# Patient Record
Sex: Male | Born: 1955 | Race: Black or African American | Hispanic: No | Marital: Single | Smoking: Current every day smoker
Health system: Southern US, Community
[De-identification: ages and names within clinical notes are randomized; demographics above are authoritative.]

## PROBLEM LIST (undated history)

## (undated) DIAGNOSIS — E119 Type 2 diabetes mellitus without complications: Secondary | ICD-10-CM

## (undated) DIAGNOSIS — I739 Peripheral vascular disease, unspecified: Secondary | ICD-10-CM

## (undated) DIAGNOSIS — I10 Essential (primary) hypertension: Secondary | ICD-10-CM

## (undated) HISTORY — PX: COLONOSCOPY: SHX174

---

## 2006-11-30 ENCOUNTER — Emergency Department: Payer: Self-pay | Admitting: Emergency Medicine

## 2014-11-09 ENCOUNTER — Emergency Department: Payer: No Typology Code available for payment source

## 2014-11-09 ENCOUNTER — Emergency Department
Admission: EM | Admit: 2014-11-09 | Discharge: 2014-11-09 | Disposition: A | Discharge status: Home / Self Care | Payer: No Typology Code available for payment source | Attending: Emergency Medicine | Admitting: Emergency Medicine

## 2014-11-09 DIAGNOSIS — Y998 Other external cause status: Secondary | ICD-10-CM | POA: Diagnosis not present

## 2014-11-09 DIAGNOSIS — S4991XA Unspecified injury of right shoulder and upper arm, initial encounter: Secondary | ICD-10-CM | POA: Diagnosis present

## 2014-11-09 DIAGNOSIS — S40011A Contusion of right shoulder, initial encounter: Secondary | ICD-10-CM | POA: Insufficient documentation

## 2014-11-09 DIAGNOSIS — Y9389 Activity, other specified: Secondary | ICD-10-CM | POA: Insufficient documentation

## 2014-11-09 DIAGNOSIS — Y92410 Unspecified street and highway as the place of occurrence of the external cause: Secondary | ICD-10-CM | POA: Diagnosis not present

## 2014-11-09 DIAGNOSIS — S46911A Strain of unspecified muscle, fascia and tendon at shoulder and upper arm level, right arm, initial encounter: Secondary | ICD-10-CM | POA: Diagnosis not present

## 2014-11-09 DIAGNOSIS — T148XXA Other injury of unspecified body region, initial encounter: Secondary | ICD-10-CM

## 2014-11-09 DIAGNOSIS — M25511 Pain in right shoulder: Secondary | ICD-10-CM

## 2014-11-09 MED ORDER — TRAMADOL HCL 50 MG PO TABS
50.0000 mg | ORAL_TABLET | Freq: Four times a day (QID) | ORAL | Status: AC | PRN
Start: 2014-11-09 — End: 2015-11-09

## 2014-11-09 MED ORDER — TRAMADOL HCL 50 MG PO TABS
50.0000 mg | ORAL_TABLET | Freq: Once | ORAL | Status: AC
  Administered 2014-11-09: 50 mg via ORAL

## 2014-11-09 MED ORDER — IBUPROFEN 800 MG PO TABS
800.0000 mg | ORAL_TABLET | Freq: Once | ORAL | Status: AC
  Administered 2014-11-09: 800 mg via ORAL

## 2014-11-09 MED ORDER — IBUPROFEN 800 MG PO TABS
ORAL_TABLET | ORAL | Status: AC
  Administered 2014-11-09: 800 mg via ORAL
  Filled 2014-11-09: qty 1

## 2014-11-09 MED ORDER — IBUPROFEN 800 MG PO TABS: 800.0000 mg | ORAL_TABLET | Freq: Three times a day (TID) | ORAL | Status: DC | PRN

## 2014-11-09 MED ORDER — TRAMADOL HCL 50 MG PO TABS
ORAL_TABLET | ORAL | Status: AC
  Administered 2014-11-09: 50 mg via ORAL
  Filled 2014-11-09: qty 1
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
## 2016-05-30 ENCOUNTER — Encounter: Payer: Self-pay | Admitting: Emergency Medicine

## 2016-05-30 ENCOUNTER — Inpatient Hospital Stay
Admission: EM | Admit: 2016-05-30 | Discharge: 2016-06-02 | DRG: 369 | Disposition: A | Discharge status: Home / Self Care | Payer: Medicare Other | Attending: Internal Medicine | Admitting: Internal Medicine

## 2016-05-30 DIAGNOSIS — K226 Gastro-esophageal laceration-hemorrhage syndrome: Principal | ICD-10-CM | POA: Diagnosis present

## 2016-05-30 DIAGNOSIS — R7989 Other specified abnormal findings of blood chemistry: Secondary | ICD-10-CM | POA: Diagnosis not present

## 2016-05-30 DIAGNOSIS — E785 Hyperlipidemia, unspecified: Secondary | ICD-10-CM | POA: Diagnosis present

## 2016-05-30 DIAGNOSIS — I10 Essential (primary) hypertension: Secondary | ICD-10-CM | POA: Diagnosis present

## 2016-05-30 DIAGNOSIS — M6281 Muscle weakness (generalized): Secondary | ICD-10-CM

## 2016-05-30 DIAGNOSIS — E876 Hypokalemia: Secondary | ICD-10-CM | POA: Diagnosis present

## 2016-05-30 DIAGNOSIS — E860 Dehydration: Secondary | ICD-10-CM | POA: Diagnosis present

## 2016-05-30 DIAGNOSIS — R319 Hematuria, unspecified: Secondary | ICD-10-CM | POA: Diagnosis present

## 2016-05-30 DIAGNOSIS — D62 Acute posthemorrhagic anemia: Secondary | ICD-10-CM | POA: Diagnosis present

## 2016-05-30 DIAGNOSIS — Z7982 Long term (current) use of aspirin: Secondary | ICD-10-CM

## 2016-05-30 DIAGNOSIS — Z79899 Other long term (current) drug therapy: Secondary | ICD-10-CM | POA: Diagnosis not present

## 2016-05-30 DIAGNOSIS — I429 Cardiomyopathy, unspecified: Secondary | ICD-10-CM | POA: Diagnosis present

## 2016-05-30 DIAGNOSIS — N179 Acute kidney failure, unspecified: Secondary | ICD-10-CM | POA: Diagnosis present

## 2016-05-30 DIAGNOSIS — Z23 Encounter for immunization: Secondary | ICD-10-CM

## 2016-05-30 DIAGNOSIS — Z7984 Long term (current) use of oral hypoglycemic drugs: Secondary | ICD-10-CM | POA: Diagnosis not present

## 2016-05-30 DIAGNOSIS — K2980 Duodenitis without bleeding: Secondary | ICD-10-CM | POA: Diagnosis present

## 2016-05-30 DIAGNOSIS — E119 Type 2 diabetes mellitus without complications: Secondary | ICD-10-CM | POA: Diagnosis present

## 2016-05-30 DIAGNOSIS — F17200 Nicotine dependence, unspecified, uncomplicated: Secondary | ICD-10-CM | POA: Diagnosis present

## 2016-05-30 DIAGNOSIS — F1010 Alcohol abuse, uncomplicated: Secondary | ICD-10-CM | POA: Diagnosis present

## 2016-05-30 DIAGNOSIS — K2970 Gastritis, unspecified, without bleeding: Secondary | ICD-10-CM | POA: Diagnosis present

## 2016-05-30 DIAGNOSIS — K920 Hematemesis: Secondary | ICD-10-CM | POA: Diagnosis present

## 2016-05-30 DIAGNOSIS — Z833 Family history of diabetes mellitus: Secondary | ICD-10-CM | POA: Diagnosis not present

## 2016-05-30 DIAGNOSIS — K3189 Other diseases of stomach and duodenum: Secondary | ICD-10-CM | POA: Diagnosis present

## 2016-05-30 DIAGNOSIS — I959 Hypotension, unspecified: Secondary | ICD-10-CM | POA: Diagnosis present

## 2016-05-30 DIAGNOSIS — K922 Gastrointestinal hemorrhage, unspecified: Secondary | ICD-10-CM | POA: Diagnosis present

## 2016-05-30 HISTORY — DX: Type 2 diabetes mellitus without complications: E11.9

## 2016-05-30 HISTORY — DX: Essential (primary) hypertension: I10

## 2016-05-30 LAB — CBC
HCT: 35.4 % — ABNORMAL LOW (ref 40.0–52.0)
Hemoglobin: 12.2 g/dL — ABNORMAL LOW (ref 13.0–18.0)
MCH: 34.2 pg — ABNORMAL HIGH (ref 26.0–34.0)
MCHC: 34.4 g/dL (ref 32.0–36.0)
MCV: 99.5 fL (ref 80.0–100.0)
Platelets: 199 10*3/uL (ref 150–440)
RBC: 3.55 MIL/uL — ABNORMAL LOW (ref 4.40–5.90)
RDW: 14.9 % — AB (ref 11.5–14.5)
WBC: 10.5 10*3/uL (ref 3.8–10.6)

## 2016-05-30 LAB — PROTIME-INR
INR: 0.99
PROTHROMBIN TIME: 13.1 s (ref 11.4–15.2)

## 2016-05-30 LAB — COMPREHENSIVE METABOLIC PANEL
ALBUMIN: 3.9 g/dL (ref 3.5–5.0)
ALT: 15 U/L — ABNORMAL LOW (ref 17–63)
AST: 20 U/L (ref 15–41)
Alkaline Phosphatase: 50 U/L (ref 38–126)
Anion gap: 14 (ref 5–15)
BUN: 68 mg/dL — AB (ref 6–20)
CO2: 28 mmol/L (ref 22–32)
Calcium: 8.9 mg/dL (ref 8.9–10.3)
Chloride: 96 mmol/L — ABNORMAL LOW (ref 101–111)
Creatinine, Ser: 2.13 mg/dL — ABNORMAL HIGH (ref 0.61–1.24)
GFR calc Af Amer: 37 mL/min — ABNORMAL LOW (ref >60)
GFR calc non Af Amer: 32 mL/min — ABNORMAL LOW (ref >60)
Glucose, Bld: 242 mg/dL — ABNORMAL HIGH (ref 65–99)
Potassium: 2.9 mmol/L — ABNORMAL LOW (ref 3.5–5.1)
SODIUM: 138 mmol/L (ref 135–145)
Total Bilirubin: 0.9 mg/dL (ref 0.3–1.2)
Total Protein: 6.9 g/dL (ref 6.5–8.1)

## 2016-05-30 LAB — MAGNESIUM: MAGNESIUM: 1.8 mg/dL (ref 1.7–2.4)

## 2016-05-30 LAB — APTT: APTT: 28 s (ref 24–36)

## 2016-05-30 LAB — GLUCOSE, CAPILLARY: Glucose-Capillary: 148 mg/dL — ABNORMAL HIGH (ref 65–99)

## 2016-05-30 LAB — TROPONIN I
TROPONIN I: 0.18 ng/mL — AB (ref <0.03)
Troponin I: 0.06 ng/mL (ref <0.03)

## 2016-05-30 LAB — TYPE AND SCREEN
ABO/RH(D): O POS
Antibody Screen: NEGATIVE

## 2016-05-30 LAB — HEMOGLOBIN: HEMOGLOBIN: 9.6 g/dL — AB (ref 13.0–18.0)

## 2016-05-30 MED ORDER — SODIUM CHLORIDE 0.9 % IV SOLN
1000.0000 mL | Freq: Once | INTRAVENOUS | Status: AC
  Administered 2016-05-30: 1000 mL via INTRAVENOUS

## 2016-05-30 MED ORDER — ONDANSETRON HCL 4 MG PO TABS: 4.0000 mg | ORAL_TABLET | Freq: Four times a day (QID) | ORAL | Status: DC | PRN

## 2016-05-30 MED ORDER — ACETAMINOPHEN 650 MG RE SUPP: 650.0000 mg | Freq: Four times a day (QID) | RECTAL | Status: DC | PRN

## 2016-05-30 MED ORDER — ACETAMINOPHEN 325 MG PO TABS: 650.0000 mg | ORAL_TABLET | Freq: Four times a day (QID) | ORAL | Status: DC | PRN

## 2016-05-30 MED ORDER — POTASSIUM CHLORIDE IN NACL 40-0.9 MEQ/L-% IV SOLN
INTRAVENOUS | Status: DC
  Administered 2016-05-30 – 2016-05-31 (×5): 125 mL/h via INTRAVENOUS
  Filled 2016-05-30 (×9): qty 1000

## 2016-05-30 MED ORDER — ONDANSETRON HCL 4 MG/2ML IJ SOLN: 4.0000 mg | Freq: Four times a day (QID) | INTRAMUSCULAR | Status: DC | PRN

## 2016-05-30 MED ORDER — ATORVASTATIN CALCIUM 20 MG PO TABS
40.0000 mg | ORAL_TABLET | Freq: Every day | ORAL | Status: DC
Start: 2016-05-30 — End: 2016-06-02
  Administered 2016-05-30 – 2016-06-02 (×3): 40 mg via ORAL
  Filled 2016-05-30 (×3): qty 2

## 2016-05-30 MED ORDER — POTASSIUM CHLORIDE CRYS ER 20 MEQ PO TBCR
40.0000 meq | EXTENDED_RELEASE_TABLET | Freq: Once | ORAL | Status: AC
  Administered 2016-05-30: 40 meq via ORAL
  Filled 2016-05-30: qty 2

## 2016-05-30 MED ORDER — PANTOPRAZOLE SODIUM 40 MG IV SOLR
40.0000 mg | Freq: Two times a day (BID) | INTRAVENOUS | Status: DC
  Administered 2016-05-30 – 2016-06-02 (×6): 40 mg via INTRAVENOUS
  Filled 2016-05-30 (×6): qty 40

## 2016-05-30 MED ORDER — ALBUTEROL SULFATE (2.5 MG/3ML) 0.083% IN NEBU: 2.5000 mg | INHALATION_SOLUTION | RESPIRATORY_TRACT | Status: DC | PRN

## 2016-05-30 MED ORDER — NICOTINE 21 MG/24HR TD PT24
21.0000 mg | MEDICATED_PATCH | Freq: Every day | TRANSDERMAL | Status: DC
  Administered 2016-05-30 – 2016-06-02 (×3): 21 mg via TRANSDERMAL
  Filled 2016-05-30 (×3): qty 1

## 2016-05-30 NOTE — ED Notes (Signed)
Patient would like his friend/caregiver called for all updates in regards to his condition and health as well as can give health information on the patient.  She is Arnell SievingDebra Warren, and can be reached at [phone].  Patient's friend is hard of hearing but understands when spoken to in a louder voice.

## 2016-05-30 NOTE — ED Notes (Signed)
ED Provider at bedside. 

## 2016-05-30 NOTE — H&P (Signed)
[employer] [facility] [facility] [place] [facility]   PATIENT NAME: Evan Rose    MR#:  [id number]  DATE OF BIRTH:  11-Aug-1955  DATE OF ADMISSION:  05/30/2016  PRIMARY CARE PHYSICIAN: [facility]   REQUESTING/REFERRING PHYSICIAN: Jene Everyobert Kinner, MD  CHIEF COMPLAINT:   Chief Complaint  Patient presents with  . Hematemesis  . Hematuria   Bloody stool, melena and generalized weakness for 2 days. HISTORY OF PRESENT ILLNESS:  Evan Fridayorman Florentino  is a 60 y.o. male with a known history of Hypertension and diabetes. The patient was sent to ED due to above chief complaint. Patient complains of generalized weakness and almost fell out twice yesterday. He complains of generalized weakness, lightheadedness, nausea, vomiting, diarrhea,  bloody stool and melena for the past 2 days. he has also vomited bright red blood and has pain in his umbilical area in his stomach. He never used NSAIDS and in no history of PUD.   PAST MEDICAL HISTORY:   Past Medical History:  Diagnosis Date  . Diabetes mellitus without complication (HCC)   . Hypertension     PAST SURGICAL HISTORY:  History reviewed. No pertinent surgical history.  SOCIAL HISTORY:   Social History  Substance Use Topics  . Smoking status: Current Every Day Smoker    Packs/day: 2.00    Years: 50.00  . Smokeless tobacco: Never Used  . Alcohol use Yes    FAMILY HISTORY:  No family history on file.  DRUG ALLERGIES:  No Known Allergies  REVIEW OF SYSTEMS:   Review of Systems  Constitutional: Positive for malaise/fatigue. Negative for chills and fever.  HENT: Negative for congestion and nosebleeds.   Eyes: Negative for blurred vision and double vision.  Respiratory: Negative for cough, hemoptysis, shortness of breath and stridor.   Cardiovascular: Negative for chest pain and leg swelling.  Gastrointestinal: Positive for blood in stool, melena and nausea. Negative for abdominal pain, diarrhea and vomiting.    Genitourinary: Negative for dysuria and hematuria.  Musculoskeletal: Negative for joint pain.  Skin: Negative for itching and rash.  Neurological: Positive for weakness. Negative for dizziness, focal weakness and loss of consciousness.  Psychiatric/Behavioral: Negative for depression. The patient is not nervous/anxious.     MEDICATIONS AT HOME:   Prior to Admission medications   Medication Sig Start Date End Date Taking? Authorizing Provider  aspirin EC 81 MG tablet Take 81 mg by mouth daily.   Yes Historical Provider, MD  atorvastatin (LIPITOR) 40 MG tablet Take 40 mg by mouth daily.   Yes Historical Provider, MD  lisinopril (PRINIVIL,ZESTRIL) 20 MG tablet Take 20 mg by mouth daily.   Yes Historical Provider, MD  metformin (FORTAMET) 1000 MG (OSM) 24 hr tablet Take 1,000 mg by mouth daily with breakfast.   Yes Historical Provider, MD      VITAL SIGNS:  Blood pressure 90/65, pulse 79, temperature 97.5 F (36.4 C), temperature source Oral, resp. rate 12, height 5\' 5"  (1.651 m), weight 125 lb (56.7 kg), SpO2 100 %.  PHYSICAL EXAMINATION:  Physical Exam  GENERAL:  60 y.o.-year-old patient lying in the bed with no acute distress. Thin. EYES: Pupils equal, round, reactive to light and accommodation. No scleral icterus. Extraocular muscles intact.  HEENT: Head atraumatic, normocephalic. Oropharynx and nasopharynx clear.  NECK:  Supple, no jugular venous distention. No thyroid enlargement, no tenderness.  LUNGS: Normal breath sounds bilaterally, no wheezing, rales,rhonchi or crepitation. No use of accessory muscles of respiration.  CARDIOVASCULAR: S1, S2 normal. No  murmurs, rubs, or gallops.  ABDOMEN: Soft, nontender, nondistended. Bowel sounds present. No organomegaly or mass.  EXTREMITIES: No pedal edema, cyanosis, or clubbing.  NEUROLOGIC: Cranial nerves II through XII are intact. Muscle strength 5/5 in all extremities. Sensation intact. Gait not checked.  PSYCHIATRIC: The patient is  alert and oriented x 2.  SKIN: No obvious rash, lesion, or ulcer.   LABORATORY PANEL:   CBC  Recent Labs Lab 05/30/16 1154  WBC 10.5  HGB 12.2*  HCT 35.4*  PLT 199   ------------------------------------------------------------------------------------------------------------------  Chemistries   Recent Labs Lab 05/30/16 1154  NA 138  K 2.9*  CL 96*  CO2 28  GLUCOSE 242*  BUN 68*  CREATININE 2.13*  CALCIUM 8.9  AST 20  ALT 15*  ALKPHOS 50  BILITOT 0.9   ------------------------------------------------------------------------------------------------------------------  Cardiac Enzymes  Recent Labs Lab 05/30/16 1154  TROPONINI 0.06*   ------------------------------------------------------------------------------------------------------------------  RADIOLOGY:  No results found.    IMPRESSION AND PLAN:   GI bleeding, unclear etiology. The patient will be admitted to medical floor, keep nothing by mouth except medication, IV fluid support, Protonix IV twice a day, follow-up hemoglobin every 8 hours and GI consult.  Hypotension, due to renal failure and dehydration and GI bleeding. Hold hypertension medication. His blood pressure was 70/50. He was treated with normal saline bolus blood pressure is better at 90s.  Acute renal failure. Hold lisinopril and metformin, IV fluid support and follow-up BMP.  Elevated troponin. Possible due to renal failure and hypotension. Follow-up troponin, no aspirin or anticoagulation due to GI bleeding.  Hypokalemia. Give potassium supplement with IV fluid support and follow-up BMP and magnesium level.  Diabetes. Start a sliding scale, hold metformin.  Tobacco abuse. Smoking cessation was counseled for 3 minutes, nicotine patch.  All the records are reviewed and case discussed with ED provider. Management plans discussed with the patient, his caregiver and they are in agreement. The patient has no family member or  POA.  CODE STATUS: Full code  TOTAL TIME TAKING CARE OF THIS PATIENT: 56 minutes.    Shaune Pollackhen, Arica Bevilacqua M.D on 05/30/2016 at 2:18 PM  Between 7am to 6pm - Pager - [phone]  After 6pm go to [email or web address] - Social research officer, [email or web address] [email or web address]  [facility] [place] [facility]  Office  [phone]  CC: Primary care physician; [facility]   Note: This dictation was prepared with Dragon dictation along with smaller phrase technology. Any transcriptional errors that result from this process are unintentional.

## 2016-05-30 NOTE — ED Provider Notes (Signed)
[facility] Emergency Department Provider Note   ____________________________________________    I have reviewed the triage vital signs and the nursing notes.   HISTORY  Chief Complaint weakness    HPI Evan Simplerorman Mangan Jr. is a 60 y.o. male who presents with weakness. Patient reports he "almost fell out twice yesterday ". He complains of lightheadedness. He also reports of diarrhea and when pressed reports blood in his stools. He reports he has also vomited bright red blood and has pain in his umbilical area in his stomach. He is not on blood thinners. He has history of diabetes and high blood pressure. No history of peptic ulcer disease. No history of GI bleed. He denies chest pain.   Past Medical History:  Diagnosis Date  . Diabetes mellitus without complication (HCC)   . Hypertension     There are no active problems to display for this patient.   History reviewed. No pertinent surgical history.  Prior to Admission medications   Medication Sig Start Date End Date Taking? Authorizing Provider  ibuprofen (ADVIL,MOTRIN) 800 MG tablet Take 1 tablet (800 mg total) by mouth every 8 (eight) hours as needed. 11/09/14   III Rosalyn GessWilliam C Ruffian, PA-C     Allergies Patient has no known allergies.  No family history on file.  Social History Social History  Substance Use Topics  . Smoking status: Current Every Day Smoker  . Smokeless tobacco: Never Used  . Alcohol use Yes    Review of Systems  Constitutional: No fever/chills Eyes: No visual changes.   Cardiovascular: Denies chest pain. Respiratory: Denies Cough Gastrointestinal: As above  Musculoskeletal: Negative for back pain. Skin: Negative for pallor Neurological: Negative for headaches or weakness  10-point ROS otherwise negative.  ____________________________________________   PHYSICAL EXAM:  VITAL SIGNS: ED Triage Vitals  Enc Vitals Group     BP 05/30/16 1142 (!) 70/50   Pulse Rate 05/30/16 1142 (!) 122     Resp 05/30/16 1142 20     Temp 05/30/16 1142 97.5 F (36.4 C)     Temp Source 05/30/16 1142 Oral     SpO2 05/30/16 1142 100 %     Weight 05/30/16 1142 125 lb (56.7 kg)     Height 05/30/16 1142 5\' 5"  (1.651 m)     Head Circumference --      Peak Flow --      Pain Score 05/30/16 1143 8     Pain Loc --      Pain Edu? --      Excl. in GC? --     Constitutional: Alert and oriented. No acute distress. Eyes: Conjunctivae are normal. No pallor  Nose: No congestion/rhinnorhea. Mouth/Throat: Mucous membranes are moist.    Cardiovascular: Tachycardia, regular rhythm. Grossly normal heart sounds.  Good peripheral circulation. Respiratory: Normal respiratory effort.  No retractions. Lungs CTAB. Gastrointestinal: Soft and nontender. No distention.  No CVA tenderness.Black stool/melena, guaiac positive Genitourinary: deferred Musculoskeletal: No lower extremity tenderness nor edema.  Warm and well perfused Neurologic:  Normal speech and language. No gross focal neurologic deficits are appreciated.  Skin:  Skin is warm, dry and intact. No rash noted. Psychiatric: Mood and affect are normal. Speech and behavior are normal.  ____________________________________________   LABS (all labs ordered are listed, but only abnormal results are displayed)  Labs Reviewed  CBC - Abnormal; Notable for the following:       Result Value   RBC 3.55 (*)    Hemoglobin 12.2 (*)  HCT 35.4 (*)    MCH 34.2 (*)    RDW 14.9 (*)    All other components within normal limits  COMPREHENSIVE METABOLIC PANEL - Abnormal; Notable for the following:    Potassium 2.9 (*)    Chloride 96 (*)    Glucose, Bld 242 (*)    BUN 68 (*)    Creatinine, Ser 2.13 (*)    ALT 15 (*)    GFR calc non Af Amer 32 (*)    GFR calc Af Amer 37 (*)    All other components within normal limits  TROPONIN I - Abnormal; Notable for the following:    Troponin I 0.06 (*)    All other components within  normal limits  PROTIME-INR  APTT  TYPE AND SCREEN   ____________________________________________  EKG  ED ECG REPORT I, Jene EveryKINNER, Danaiya Steadman, the attending physician, personally viewed and interpreted this ECG.  Date: 05/30/2016 EKG Time: 12:21 PM Rate: 91 Rhythm: normal sinus rhythm QRS Axis: normal Intervals: normal ST/T Wave abnormalities: Nonspecific Conduction Disturbances: PVC   ____________________________________________  RADIOLOGY  None ____________________________________________   PROCEDURES  Procedure(s) performed: No    Critical Care performed: yes  CRITICAL CARE Performed by: Jene EveryKINNER, Sesilia Poucher   Total critical care time:30 minutes  Critical care time was exclusive of separately billable procedures and treating other patients.  Critical care was necessary to treat or prevent imminent or life-threatening deterioration.  Critical care was time spent personally by me on the following activities: development of treatment plan with patient and/or surrogate as well as nursing, discussions with consultants, evaluation of patient's response to treatment, examination of patient, obtaining history from patient or surrogate, ordering and performing treatments and interventions, ordering and review of laboratory studies, ordering and review of radiographic studies, pulse oximetry and re-evaluation of patient's condition.  ____________________________________________   INITIAL IMPRESSION / ASSESSMENT AND PLAN / ED COURSE  Pertinent labs & imaging results that were available during my care of the patient were reviewed by me and considered in my medical decision making (see chart for details).  Patient presents with near syncopal episodes and GI bleeding. He is markedly hypertensive and tachycardia upon arrival. This improved with lying down and IV fluids. We will check labs, EKG, give fluids and monitor carefully  Clinical Course as of May 30 1314  Sat May 30, 2016    1209 Hemoglobin: Marland Kitchen(!) 12.2 [RK]    Clinical Course User Index [RK] Jene Everyobert Noel Rodier, MD  Patient's hemoglobin is 12.2, no comparison available. He does have melanotic stool and an elevated creatinine. IV fluids infusing, type and screen sent. We will admit to the hospital for further management ____________________________________________   FINAL CLINICAL IMPRESSION(S) / ED DIAGNOSES  Final diagnoses:  Upper GI bleed      NEW MEDICATIONS STARTED DURING THIS VISIT:  New Prescriptions   No medications on file     Note:  This document was prepared using Dragon voice recognition software and may include unintentional dictation errors.    Jene Everyobert Teira Arcilla, MD 05/30/16 [phone]

## 2016-05-30 NOTE — ED Notes (Signed)
Admitting MD at bedside.

## 2016-05-30 NOTE — ED Triage Notes (Signed)
States he noticed some blood in his urine and stools 2 nights ago  Also vomited some blood   Last time vomited was last pm and also noted some blood in urine last pm

## 2016-05-31 LAB — BASIC METABOLIC PANEL
Anion gap: 4 — ABNORMAL LOW (ref 5–15)
BUN: 36 mg/dL — AB (ref 6–20)
CALCIUM: 8 mg/dL — AB (ref 8.9–10.3)
CO2: 26 mmol/L (ref 22–32)
CREATININE: 0.96 mg/dL (ref 0.61–1.24)
Chloride: 110 mmol/L (ref 101–111)
GFR calc non Af Amer: >60 mL/min (ref >60)
Glucose, Bld: 109 mg/dL — ABNORMAL HIGH (ref 65–99)
Potassium: 3.7 mmol/L (ref 3.5–5.1)
Sodium: 140 mmol/L (ref 135–145)

## 2016-05-31 LAB — HEMOGLOBIN
HEMOGLOBIN: 8.8 g/dL — AB (ref 13.0–18.0)
Hemoglobin: 8.5 g/dL — ABNORMAL LOW (ref 13.0–18.0)

## 2016-05-31 LAB — TROPONIN I: Troponin I: 0.26 ng/mL (ref <0.03)

## 2016-05-31 MED ORDER — PNEUMOCOCCAL VAC POLYVALENT 25 MCG/0.5ML IJ INJ
0.5000 mL | INJECTION | INTRAMUSCULAR | Status: AC
  Administered 2016-06-02: 0.5 mL via INTRAMUSCULAR
  Filled 2016-05-31: qty 0.5

## 2016-05-31 NOTE — Progress Notes (Signed)
[facility] of [employer]

## 2016-05-31 NOTE — Consult Note (Signed)
GI Inpatient Consult Note  Reason for Consult: GI bleed   Attending Requesting Consult: Dr. Chen  History oImogene Burnf Present Illness: Evan Rose Jr. is a 60 y.o. male with a known history of DM II and HTN admitted with a GI bleed.  Patient reports experiencing severe epigastric pain and nausea, followed by vomiting "dark blood and red blood" on Thursday night.  His next BM was loose and "black with red blood" intermixed.   He also noted generalized weakness and lightheadedness when trying to get up from the commode.  Patient endorses intermittent epigastric pain, nausea, and vomiting over the past several months, but did not seek medication attention until he saw blood in his vomit and stool.  He reports "drinking way to much liquor" on Thursday night, and notes a h/o EtOH abuse.  He also endorses smoking 2 packs of cigarettes per day for "many years".  He takes Ibuprofen "now and then" for headaches, typically about once per week.  No other blood thinning medications.  Patient has mild intermittent heartburn and acid reflux, but does not take antacids.  He denies a FHx of colon cancer, colon polyps, or other GI malignancy.  No prior EGDs or colonoscopies.  Upon arrival to the ED, patient was hypertensive with BP 70/50.  This improved w/ IV fluids. Stool was guaiac positive. Labs demonstrated mild anemia (Hgb 12.2, Hct 35.4) and acute renal failure (BUN 68, Cr 2.13).  Patient was admitted for further management, including IV fluids, IV Protonix, and GI consultation.  Patient notes epigastric pain has improved since admission, and he has not experienced nausea, vomiting, or black/bloody stools since Friday morning.  This morning, Hgb decreased from 9.6 > 8.8.  Renal function improved (BUN 36, Cr WNL).  Notably, troponin continues to rise (0.06 > 0.18 > 0.26).  Past Medical History:  Past Medical History:  Diagnosis Date  . Diabetes mellitus without complication (HCC)   . Hypertension     Problem  List: Patient Active Problem List   Diagnosis Date Noted  . GIB (gastrointestinal bleeding) 05/30/2016    Past Surgical History: History reviewed. No pertinent surgical history.   Allergies: No Known Allergies   Home Medications: Prescriptions Prior to Admission  Medication Sig Dispense Refill Last Dose  . aspirin EC 81 MG tablet Take 81 mg by mouth daily.   05/28/2016 at 0800  . atorvastatin (LIPITOR) 40 MG tablet Take 40 mg by mouth daily.   05/28/2016 at 0800  . lisinopril (PRINIVIL,ZESTRIL) 20 MG tablet Take 20 mg by mouth daily.   05/28/2016 at 0800  . metformin (FORTAMET) 1000 MG (OSM) 24 hr tablet Take 1,000 mg by mouth daily with breakfast.   05/28/2016 at 0800   Home medication reconciliation was completed with the patient.   Scheduled Inpatient Medications:   . atorvastatin  40 mg Oral Daily  . nicotine  21 mg Transdermal Daily  . pantoprazole (PROTONIX) IV  40 mg Intravenous Q12H    Continuous Inpatient Infusions:   . 0.9 % NaCl with KCl 40 mEq / L 125 mL/hr (05/31/16 0739)    PRN Inpatient Medications:  acetaminophen **OR** acetaminophen, albuterol, ondansetron **OR** ondansetron (ZOFRAN) IV  Family History: family history includes Diabetes in his sister.   Social History:   reports that he has been smoking.  He has a 100.00 pack-year smoking history. He has never used smokeless tobacco. He reports that he drinks alcohol. He reports that he does not use drugs.   Review of Systems: Constitutional:  Weight is stable.  Eyes: No changes in vision. ENT: No oral lesions, sore throat.  GI: see HPI.  Heme/Lymph: No easy bruising.  CV: No chest pain.  GU: No hematuria.  Integumentary: No rashes.  Neuro: No headaches.  Psych: No depression/anxiety.  Endocrine: No heat/cold intolerance.  Allergic/Immunologic: No urticaria.  Resp: No cough, SOB.  Musculoskeletal: No joint swelling.    Physical Examination: BP (!) 144/71 (BP Location: Right Arm)   Pulse 91    Temp 98.1 F (36.7 C) (Oral)   Resp 18   Ht 5\' 5"  (1.651 m)   Wt 56.7 kg (125 lb)   SpO2 100%   BMI 20.80 kg/m  Gen: NAD, alert and oriented x 4 HEENT: PEERLA, EOMI, Neck: supple, no JVD or thyromegaly Chest: CTA bilaterally, no wheezes, crackles, or other adventitious sounds CV: RRR, no m/g/c/r Abd: soft, NT, mild epigastric tenderness to moderate palpation, +BS in all four quadrants; no HSM, guarding, ridigity, or rebound tenderness Ext: no edema, well perfused with 2+ pulses Skin: no rash or lesions noted Lymph: no LAD  Data: Lab Results  Component Value Date   WBC 10.5 05/30/2016   HGB 8.8 (L) 05/31/2016   HCT 35.4 (L) 05/30/2016   MCV 99.5 05/30/2016   PLT 199 05/30/2016    Recent Labs Lab 05/30/16 1154 05/30/16 1827 05/31/16 0054  HGB 12.2* 9.6* 8.8*   Lab Results  Component Value Date   NA 140 05/31/2016   K 3.7 05/31/2016   CL 110 05/31/2016   CO2 26 05/31/2016   BUN 36 (H) 05/31/2016   CREATININE 0.96 05/31/2016   Lab Results  Component Value Date   ALT 15 (L) 05/30/2016   AST 20 05/30/2016   ALKPHOS 50 05/30/2016   BILITOT 0.9 05/30/2016    Recent Labs Lab 05/30/16 1154  APTT 28  INR 0.99   Assessment/Plan: Evan Rose is a 60 y.o. male DM II and HTN admitted with a GI bleed.  Upon arrival to the ED patient was hypotensive w/ acute renal failure, which improved with IV fluids.  Hgb has trended down from 12.2 > 9.6 > 8.8.  FOBT + in the ED.  Patient admits to excess EtOH use on Thursday, as well as h/o EtOH abuse and smoking 2 ppd.  He also uses Ibuprofen about once per week.  Therefore, patient likely has an upper GI bleed due to ulcers and/or gastritis, signified by new anemia, elevated BUN, and + FOBT.    Recommendations: - Plan for EGD tomorrow per Dr. Mechele CollinElliott - May may have water only, then NPO after midnight - Monitor Hgb, transfuse <7 - Continue IV Protonix 40mg  q 12 hrs - Avoid all NSAIDs - Counseled patient extensively regarding EtOH  and tobacco cessation.  I encouraged him to discuss resources with social work. - Further recs pending patient's progress  Thank you for the consult. We will follow along with you. Please call with questions or concerns.  Burman FreestoneMichelle C Tayvin Preslar, PA-C [facility] Phone: [phone] Pager: [phone]) [phone]

## 2016-05-31 NOTE — Progress Notes (Signed)
[facility] - [place] at [facility]   PATIENT NAME: Evan Rose    MR#:  [id number]  DATE OF BIRTH:  October 07, 1955  SUBJECTIVE:   Patient is here due to coffee-ground emesis and also melanotic stools and suspected to have a upper GI bleed. Hemoglobin has dropped from 12.2-8.5. No acute bleeding this morning. No abdominal pain, nausea, vomiting. Plan for endoscopy tomorrow as per GI.  REVIEW OF SYSTEMS:    Review of Systems  Constitutional: Negative for chills and fever.  HENT: Negative for congestion and tinnitus.   Eyes: Negative for blurred vision and double vision.  Respiratory: Negative for cough, shortness of breath and wheezing.   Cardiovascular: Negative for chest pain, orthopnea and PND.  Gastrointestinal: Negative for abdominal pain, diarrhea, nausea and vomiting.  Genitourinary: Negative for dysuria and hematuria.  Neurological: Negative for dizziness, sensory change and focal weakness.  All other systems reviewed and are negative.   Nutrition: Clear liquid just water Tolerating Diet: Yes Tolerating PT: Ambulatory   DRUG ALLERGIES:  No Known Allergies  VITALS:  Blood pressure (!) 144/71, pulse 91, temperature 98.1 F (36.7 C), temperature source Oral, resp. rate 18, height 5\' 5"  (1.651 m), weight 56.7 kg (125 lb), SpO2 100 %.  PHYSICAL EXAMINATION:   Physical Exam  GENERAL:  60 y.o.-year-old patient lying in the bed in no acute distress.  EYES: Pupils equal, round, reactive to light and accommodation. No scleral icterus. Extraocular muscles intact.  HEENT: Head atraumatic, normocephalic. Oropharynx and nasopharynx clear.  NECK:  Supple, no jugular venous distention. No thyroid enlargement, no tenderness.  LUNGS: Normal breath sounds bilaterally, no wheezing, rales, rhonchi. No use of accessory muscles of respiration.  CARDIOVASCULAR: S1, S2 normal. No murmurs, rubs, or gallops.  ABDOMEN: Soft, nontender, nondistended. Bowel sounds present. No  organomegaly or mass.  EXTREMITIES: No cyanosis, clubbing or edema b/l.    NEUROLOGIC: Cranial nerves II through XII are intact. No focal Motor or sensory deficits b/l.   PSYCHIATRIC: The patient is alert and oriented x 3.  SKIN: No obvious rash, lesion, or ulcer.    LABORATORY PANEL:   CBC  Recent Labs Lab 05/30/16 1154  05/31/16 1037  WBC 10.5  --   --   HGB 12.2*  < > 8.5*  HCT 35.4*  --   --   PLT 199  --   --   < > = values in this interval not displayed. ------------------------------------------------------------------------------------------------------------------  Chemistries   Recent Labs Lab 05/30/16 1154 05/30/16 1827 05/31/16 0054  NA 138  --  140  K 2.9*  --  3.7  CL 96*  --  110  CO2 28  --  26  GLUCOSE 242*  --  109*  BUN 68*  --  36*  CREATININE 2.13*  --  0.96  CALCIUM 8.9  --  8.0*  MG  --  1.8  --   AST 20  --   --   ALT 15*  --   --   ALKPHOS 50  --   --   BILITOT 0.9  --   --    ------------------------------------------------------------------------------------------------------------------  Cardiac Enzymes  Recent Labs Lab 05/31/16 0054  TROPONINI 0.26*   ------------------------------------------------------------------------------------------------------------------  RADIOLOGY:  No results found.   ASSESSMENT AND PLAN:   60 year old male with past medical history of Diabetes, hypertension who presented to the hospital with coffee-ground emesis and melanotic stools.  1. Upper GI bleed-hemoglobin has dropped from 12-8.5. Patient hasn't had no further acute  bleeding this a.m. -Follow serial hemoglobin, continue Protonix. Transfuse his hemoglobin drops less than 7. -Appreciate gastroenterology consult and plan for endoscopy tomorrow.  2. Elevated Troponin - suspect Demand ischemia. No evidence of ACS.  - no chest pain. Cont. Tele. Will check 2-D Echo.   3. Hyperlipidemia - cont. Atorvastatin.   4. AKI - due to dehydration,  volume loss.  - improved with IV fluid hydration.    5. Tobacco abuse - cont. Nicotine patch.     All the records are reviewed and case discussed with Care Management/Social Worker. Management plans discussed with the patient, family and they are in agreement.  CODE STATUS: Full code  DVT Prophylaxis: Ted's & Scd's  TOTAL TIME TAKING CARE OF THIS PATIENT: 30 minutes.   POSSIBLE D/C IN 2-3 DAYS, DEPENDING ON CLINICAL CONDITION.   Houston SirenSAINANI,Evan Rose J M.D on 05/31/2016 at 12:54 PM  Between 7am to 6pm - Pager - [phone]  After 6pm go to [email or web address] - Scientist, research (life sciences)password [email or web address] [email or web address]  [facility] [place] [facility]  Office  [phone]  CC: Primary care physician; [facility]

## 2016-06-01 ENCOUNTER — Inpatient Hospital Stay: Payer: Medicare Other | Admitting: Anesthesiology

## 2016-06-01 ENCOUNTER — Encounter
Admission: EM | Disposition: A | Discharge status: Home / Self Care | Payer: Self-pay | Source: Home / Self Care | Attending: Specialist

## 2016-06-01 ENCOUNTER — Inpatient Hospital Stay (HOSPITAL_COMMUNITY)
Admit: 2016-06-01 | Discharge: 2016-06-01 | Disposition: A | Discharge status: Home / Self Care | Payer: Medicare Other | Attending: Specialist | Admitting: Specialist

## 2016-06-01 DIAGNOSIS — R7989 Other specified abnormal findings of blood chemistry: Secondary | ICD-10-CM

## 2016-06-01 HISTORY — PX: ESOPHAGOGASTRODUODENOSCOPY (EGD) WITH PROPOFOL: SHX5813

## 2016-06-01 LAB — ECHOCARDIOGRAM COMPLETE
HEIGHTINCHES: 65 in
Weight: 2000 oz

## 2016-06-01 LAB — CBC
HCT: 25.3 % — ABNORMAL LOW (ref 40.0–52.0)
Hemoglobin: 8.5 g/dL — ABNORMAL LOW (ref 13.0–18.0)
MCH: 33 pg (ref 26.0–34.0)
MCHC: 33.5 g/dL (ref 32.0–36.0)
MCV: 98.5 fL (ref 80.0–100.0)
Platelets: 125 10*3/uL — ABNORMAL LOW (ref 150–440)
RBC: 2.56 MIL/uL — ABNORMAL LOW (ref 4.40–5.90)
RDW: 14.6 % — AB (ref 11.5–14.5)
WBC: 7.4 10*3/uL (ref 3.8–10.6)

## 2016-06-01 SURGERY — ESOPHAGOGASTRODUODENOSCOPY (EGD) WITH PROPOFOL
Anesthesia: General

## 2016-06-01 MED ORDER — MIDAZOLAM HCL 5 MG/5ML IJ SOLN
INTRAMUSCULAR | Status: DC | PRN
  Administered 2016-06-01: 1 mg via INTRAVENOUS

## 2016-06-01 MED ORDER — SODIUM CHLORIDE 0.9 % IV SOLN
400.0000 mg | Freq: Once | INTRAVENOUS | Status: AC
  Administered 2016-06-01: 400 mg via INTRAVENOUS
  Filled 2016-06-01: qty 20

## 2016-06-01 MED ORDER — FENTANYL CITRATE (PF) 100 MCG/2ML IJ SOLN
INTRAMUSCULAR | Status: DC | PRN
  Administered 2016-06-01: 50 ug via INTRAVENOUS

## 2016-06-01 MED ORDER — MAGNESIUM SULFATE 2 GM/50ML IV SOLN
2.0000 g | Freq: Once | INTRAVENOUS | Status: AC
  Administered 2016-06-01: 2 g via INTRAVENOUS
  Filled 2016-06-01: qty 50

## 2016-06-01 MED ORDER — LIDOCAINE 2% (20 MG/ML) 5 ML SYRINGE
INTRAMUSCULAR | Status: DC | PRN
  Administered 2016-06-01: 40 mg via INTRAVENOUS

## 2016-06-01 MED ORDER — PROPOFOL 500 MG/50ML IV EMUL
INTRAVENOUS | Status: DC | PRN
  Administered 2016-06-01: 140 ug/kg/min via INTRAVENOUS

## 2016-06-01 MED ORDER — SODIUM CHLORIDE 0.9 % IV SOLN
INTRAVENOUS | Status: DC
  Administered 2016-06-01: 12:00:00 via INTRAVENOUS

## 2016-06-01 MED ORDER — PROPOFOL 10 MG/ML IV BOLUS
INTRAVENOUS | Status: DC | PRN
  Administered 2016-06-01: 100 mg via INTRAVENOUS

## 2016-06-01 NOTE — Progress Notes (Deleted)
Pt discharged home with wife. O2 cylinder delivered to the room. Pt and wife are agreeable with the plan of care. All questions answered.

## 2016-06-01 NOTE — Clinical Social Work Note (Signed)
Clinical Social Work Assessment  Patient Details  Name: Evan Rose. MRN: [id number] Date of Birth: 10-Jul-1955  Date of referral:  06/01/16               Reason for consult:  Other (Comment Required) (From [employer]. )                Permission sought to share information with:    Permission granted to share information::     Name::        Agency::     Relationship::     Contact Information:     Housing/Transportation Living arrangements for the past 2 months:  International Paper of Information:  Patient Patient Interpreter Needed:  None Criminal Activity/Legal Involvement Pertinent to Current Situation/Hospitalization:  No - Comment as needed Significant Relationships:  Other Family Members Lives with:  Self Do you feel safe going back to the place where you live?  Yes Need for family participation in patient care:  No (Coment)  Care giving concerns:  Patient lives in a boarding house in [place].    Social Worker assessment / plan:  Holiday representative ([place]) received verbal consult from RN in progression rounds that patient is from a group home. CSW met with patient alone at bedside to address consult. Patient was alert and oriented and sitting up in the bed. CSW introduced self and explained role of CSW department. Patient reported that he does not live in a group home he lives in a boarding house. The boarding house does not require an FL2. Patient reported that he does receive disability and recently got approved for a 1 bed room apartment through the [employer]. Patient reported that his friend Evan Rose is his payee and will assist him in finding an apartment. Evan Rose also provides transportation for patient. Patient reported that he plans to discharge home back to the boarding house. RN aware of above. Please reconsult if future social work needs arise. CSW signing off.     Employment status:  Disabled (Comment on whether or not currently  receiving Disability) Insurance information:  Medicaid In [place] PT Recommendations:  Not assessed at this time Information / Referral to community resources:  Other (Comment Required) (Patient has no needs at this time. )  Patient/Family's Response to care:  Patient is agreeable to D/C back home.   Patient/Family's Understanding of and Emotional Response to Diagnosis, Current Treatment, and Prognosis:  Patient was pleasant and thanked CSW for visit.   Emotional Assessment Appearance:  Appears stated age Attitude/Demeanor/Rapport:    Affect (typically observed):  Accepting, Adaptable, Pleasant Orientation:  Oriented to Self, Oriented to Place, Oriented to  Time, Oriented to Situation Alcohol / Substance use:  Not Applicable Psych involvement (Current and /or in the community):  No (Comment)  Discharge Needs  Concerns to be addressed:  Discharge Planning Concerns Readmission within the last 30 days:  No Current discharge risk:  None Barriers to Discharge:  Continued Medical Work up   [employer], Veronia Beets, LCSW 06/01/2016, 3:59 PM

## 2016-06-01 NOTE — Transfer of Care (Signed)
Immediate Anesthesia Transfer of Care Note  Patient: Evan Simplerorman Bandel Jr.  Procedure(s) Performed: Procedure(s): ESOPHAGOGASTRODUODENOSCOPY (EGD) WITH PROPOFOL (N/A)  Patient Location: PACU and Endoscopy Unit  Anesthesia Type:General  Level of Consciousness: sedated  Airway & Oxygen Therapy: Patient Spontanous Breathing and Patient connected to nasal cannula oxygen  Post-op Assessment: Report given to RN and Post -op Vital signs reviewed and stable  Post vital signs: Reviewed and stable  Last Vitals:  Vitals:   06/01/16 0741 06/01/16 1147  BP: 131/82 (!) 148/106  Pulse: 88 84  Resp: 16 16  Temp: 36.7 C 36.8 C    Last Pain:  Vitals:   06/01/16 1147  TempSrc: Tympanic  PainSc:          Complications: No apparent anesthesia complications

## 2016-06-01 NOTE — Anesthesia Postprocedure Evaluation (Signed)
Anesthesia Post Note  Patient: Evan Simplerorman Brach Jr.  Procedure(s) Performed: Procedure(s) (LRB): ESOPHAGOGASTRODUODENOSCOPY (EGD) WITH PROPOFOL (N/A)  Patient location during evaluation: Endoscopy Anesthesia Type: General Level of consciousness: awake and alert Pain management: pain level controlled Vital Signs Assessment: post-procedure vital signs reviewed and stable Respiratory status: spontaneous breathing and respiratory function stable Cardiovascular status: stable Anesthetic complications: no    Last Vitals:  Vitals:   06/01/16 1147 06/01/16 1222  BP: (!) 148/106 91/64  Pulse: 84 79  Resp: 16 16  Temp: 36.8 C 36.2 C    Last Pain:  Vitals:   06/01/16 1222  TempSrc: Tympanic  PainSc: Asleep                 KEPHART,WILLIAM K

## 2016-06-01 NOTE — Progress Notes (Signed)
Patient ID: Evan Simplerorman Schuman Jr., male   DOB: [birth year], 60 y.o.   MRN: [id number]  [facility] PROGRESS NOTE  Evan Simplerorman Rodda Jr. NWG:[id number] DOB: [birth year] DOA: 05/30/2016 PCP: [facility]  HPI/Subjective: Patient feeling well. No further nausea or vomiting. He still did have a black stool this morning. No abdominal pain.  Objective: Vitals:   06/01/16 1252 06/01/16 1302  BP: (!) 142/92 132/81  Pulse: 80 77  Resp: 19 17  Temp:      Filed Weights   05/30/16 1142 06/01/16 1147  Weight: 56.7 kg (125 lb) 56.7 kg (125 lb)    ROS: Review of Systems  Constitutional: Negative for chills and fever.  Eyes: Negative for blurred vision.  Respiratory: Negative for cough and shortness of breath.   Cardiovascular: Negative for chest pain.  Gastrointestinal: Positive for melena. Negative for abdominal pain, constipation, diarrhea, nausea and vomiting.  Genitourinary: Negative for dysuria.  Musculoskeletal: Negative for joint pain.  Neurological: Negative for dizziness and headaches.   Exam: Physical Exam  HENT:  Nose: No mucosal edema.  Mouth/Throat: No oropharyngeal exudate or posterior oropharyngeal edema.  Eyes: Conjunctivae, EOM and lids are normal. Pupils are equal, round, and reactive to light.  Neck: No JVD present. Carotid bruit is not present. No edema present. No thyroid mass and no thyromegaly present.  Cardiovascular: S1 normal and S2 normal.  Exam reveals no gallop.   No murmur heard. Pulses:      Dorsalis pedis pulses are 2+ on the right side, and 2+ on the left side.  Respiratory: No respiratory distress. He has no wheezes. He has no rhonchi. He has no rales.  GI: Soft. Bowel sounds are normal. There is no tenderness.  Musculoskeletal:       Right ankle: He exhibits no swelling.       Left ankle: He exhibits no swelling.  Lymphadenopathy:    He has no cervical adenopathy.  Neurological: He is alert. No cranial nerve deficit.  Skin: Skin is warm. No  rash noted. Nails show no clubbing.  Psychiatric: He has a normal mood and affect.      Data Reviewed: Basic Metabolic Panel:  Recent Labs Lab 05/30/16 1154 05/30/16 1827 05/31/16 0054  NA 138  --  140  K 2.9*  --  3.7  CL 96*  --  110  CO2 28  --  26  GLUCOSE 242*  --  109*  BUN 68*  --  36*  CREATININE 2.13*  --  0.96  CALCIUM 8.9  --  8.0*  MG  --  1.8  --    Liver Function Tests:  Recent Labs Lab 05/30/16 1154  AST 20  ALT 15*  ALKPHOS 50  BILITOT 0.9  PROT 6.9  ALBUMIN 3.9   CBC:  Recent Labs Lab 05/30/16 1154 05/30/16 1827 05/31/16 0054 05/31/16 1037 06/01/16 0527  WBC 10.5  --   --   --  7.4  HGB 12.2* 9.6* 8.8* 8.5* 8.5*  HCT 35.4*  --   --   --  25.3*  MCV 99.5  --   --   --  98.5  PLT 199  --   --   --  125*   Cardiac Enzymes:  Recent Labs Lab 05/30/16 1154 05/30/16 1827 05/31/16 0054  TROPONINI 0.06* 0.18* 0.26*    CBG:  Recent Labs Lab 05/30/16 1655  GLUCAP 148*    Scheduled Meds: . atorvastatin  40 mg Oral Daily  . iron sucrose  400 mg Intravenous Once  . magnesium sulfate 1 - 4 g bolus IVPB  2 g Intravenous Once  . nicotine  21 mg Transdermal Daily  . pantoprazole (PROTONIX) IV  40 mg Intravenous Q12H  . pneumococcal 23 valent vaccine  0.5 mL Intramuscular Tomorrow-1000    Assessment/Plan:  1. Upper GI bleed. EGD showing Mallory-Weiss tear, gastritis and duodenitis. Continue Protonix IV. Patient advanced to clear liquid diet. Likely home in the next 1-2 days on soft diet. Aspirin stopped. Must also stop alcohol. 2. Acute hemorrhagic anemia. Stop IV fluids to prevent further dilution. Give IV Venofer 400 mg once today. 3. Hyperlipidemia unspecified on atorvastatin 4. Hypokalemia and hypomagnesemia replace magnesium IV. Potassium already replaced. 5. Must stop drinking alcohol.  Code Status:     Code Status Orders        Start     Ordered   05/30/16 1454  Full code  Continuous     05/30/16 1453    Code Status  History    Date Active Date Inactive Code Status Order ID Comments User Context   This patient has a current code status but no historical code status.      Disposition Plan: Home tomorrow or the next day depending on clinical course.  Consultants:  Gastroenterology  Procedures:  Endoscopy  Time spent: 28 minutes  Alford HighlandWIETING, Quentez Lober  [employer]

## 2016-06-01 NOTE — Consult Note (Signed)
[facility] gastritis [facility].  No active bleeding at this time.  Would start clear liquid diet and advance as tolerated to full liquid and let go home in 1-2 days on bid PPI.

## 2016-06-01 NOTE — Progress Notes (Signed)
*  PRELIMINARY RESULTS* Echocardiogram 2D Echocardiogram has been performed.  Evan Rose, Evan Rose 06/01/2016, 8:32 AM

## 2016-06-01 NOTE — Anesthesia Preprocedure Evaluation (Signed)
Anesthesia Evaluation  Patient identified by MRN, date of birth, ID band Patient awake    Reviewed: Allergy & Precautions, NPO status , Patient's Chart, lab work & pertinent test results  History of Anesthesia Complications Negative for: history of anesthetic complications  Airway Mallampati: II       Dental  (+) Missing, Chipped   Pulmonary Current Smoker,           Cardiovascular hypertension, Pt. on medications      Neuro/Psych negative neurological ROS     GI/Hepatic negative GI ROS, (+)     substance abuse  alcohol use,   Endo/Other  diabetes, Type 2, Oral Hypoglycemic Agents  Renal/GU negative Renal ROS     Musculoskeletal   Abdominal   Peds  Hematology negative hematology ROS (+)   Anesthesia Other Findings   Reproductive/Obstetrics                             Anesthesia Physical Anesthesia Plan  ASA: III  Anesthesia Plan: General   Post-op Pain Management:    Induction: Intravenous  Airway Management Planned: Nasal Cannula  Additional Equipment:   Intra-op Plan:   Post-operative Plan:   Informed Consent: I have reviewed the patients History and Physical, chart, labs and discussed the procedure including the risks, benefits and alternatives for the proposed anesthesia with the patient or authorized representative who has indicated his/her understanding and acceptance.     Plan Discussed with:   Anesthesia Plan Comments:         Anesthesia Quick Evaluation

## 2016-06-01 NOTE — Op Note (Signed)
[facility] Gastroenterology Patient Name: Evan Rose Procedure Date: 06/01/2016 12:07 PM MRN: [id number] Account #: [id number] Date of Birth: [birth year] Admit Type: Inpatient Age: [age] Room: [facility] ENDO ROOM 4 Gender: Male Note Status: Finalized Procedure:            Upper GI endoscopy Indications:          Hematemesis, Melena Providers:            Scot Junobert T. Velta Rockholt, MD Medicines:            Propofol per Anesthesia Complications:        No immediate complications. Procedure:            Pre-Anesthesia Assessment:                       - After reviewing the risks and benefits, the patient                        was deemed in satisfactory condition to undergo the                        procedure.                       After obtaining informed consent, the endoscope was                        passed under direct vision. Throughout the procedure,                        the patient's blood pressure, pulse, and oxygen                        saturations were monitored continuously. The Endoscope                        was introduced through the mouth, and advanced to the                        second part of duodenum. The upper GI endoscopy was                        accomplished without difficulty. The patient tolerated                        the procedure well. Findings:      A non-bleeding Mallory-Weiss tear with stigmata of recent bleeding was       found. An adherent clot at the site of the tear. Seen in retroversion       and forward view.      Localized moderate inflammation characterized by erythema, friability       and granularity was found on the lesser curvature of the stomach.      Diffuse mildly erythematous mucosa without active bleeding and with no       stigmata of bleeding was found in the duodenal bulb. Impression:           - Mallory-Weiss tear.                       - Gastritis.                       -  Erythematous duodenopathy.      - No specimens collected. Recommendation:       - The findings and recommendations were discussed with                        the patient.                       - The findings and recommendations were discussed with                        the referring physician.                       Advance diet to clear and full liquids, avoid all                        alcohol, no asprin, aleve, ibuprofen, no crunchy food,                        very soft food for a few days. mashed potatoes,                        scramble eggs, pancakes. Scot Junobert T Macedonio Scallon, MD 06/01/2016 12:25:06 PM This report has been signed electronically. Number of Addenda: 0 Note Initiated On: 06/01/2016 12:07 PM      [facility]

## 2016-06-02 ENCOUNTER — Encounter: Payer: Self-pay | Admitting: Unknown Physician Specialty

## 2016-06-02 LAB — BASIC METABOLIC PANEL
Anion gap: 5 (ref 5–15)
BUN: 7 mg/dL (ref 6–20)
CALCIUM: 8.7 mg/dL — AB (ref 8.9–10.3)
CO2: 28 mmol/L (ref 22–32)
Chloride: 105 mmol/L (ref 101–111)
Creatinine, Ser: 0.85 mg/dL (ref 0.61–1.24)
GFR calc Af Amer: >60 mL/min (ref >60)
GFR calc non Af Amer: >60 mL/min (ref >60)
GLUCOSE: 114 mg/dL — AB (ref 65–99)
Potassium: 3.7 mmol/L (ref 3.5–5.1)
SODIUM: 138 mmol/L (ref 135–145)

## 2016-06-02 LAB — MAGNESIUM: Magnesium: 2.4 mg/dL (ref 1.7–2.4)

## 2016-06-02 LAB — HEMOGLOBIN: HEMOGLOBIN: 9.4 g/dL — AB (ref 13.0–18.0)

## 2016-06-02 MED ORDER — LISINOPRIL 10 MG PO TABS: 10.0000 mg | ORAL_TABLET | Freq: Every day | ORAL | 0 refills | Status: AC

## 2016-06-02 MED ORDER — NICOTINE 21 MG/24HR TD PT24: 21.0000 mg | MEDICATED_PATCH | Freq: Every day | TRANSDERMAL | 0 refills | Status: AC

## 2016-06-02 MED ORDER — PANTOPRAZOLE SODIUM 40 MG PO TBEC: 40.0000 mg | DELAYED_RELEASE_TABLET | Freq: Two times a day (BID) | ORAL | 0 refills | Status: AC

## 2016-06-02 NOTE — Discharge Planning (Signed)
Patient refusing to go to FU appt with PCP. States he only goes to Dr. As needed.  Agreed to go to Dr. Mechele CollinElliott only.

## 2016-06-02 NOTE — Evaluation (Signed)
Physical Therapy Evaluation Patient Details Name: Evan Simplerorman Coupland Jr. MRN: [id number] DOB: 18-Apr-1956 Today's Date: 06/02/2016   History of Present Illness  presented to ER secondary to hematemesis, hematuria and generalized weakness x2 days; admitted secondary to GIB.  Status post EGD revealing gastritis and Mallory-Weiss tear; no active bleeding at this time.  Current HgB 9.4.  Clinical Impression  Upon evaluation, patient alert and oriented; follows all commands and demonstrates good safety awareness/insight.  Bilat UE/LE strength and ROM grossly symmetrical and WFL (does endorse baseline paresthesia plantar surface bilat feet); no focal weakness or pain appreciated.  Able to complete bed mobility, sit/stand, basic transfers and gait (220') without assist device, mod indep/indep.  Good gait speed (10' walk time, 4-5 seconds) and good stability noted during dynamic gait components.  No skilled PT needs identified at this time, as patient appears at baseline level of functional indep.  Will complete initial order at this time; please re-consult should needs change.    Follow Up Recommendations No PT follow up    Equipment Recommendations       Recommendations for Other Services       Precautions / Restrictions Precautions Precautions: Fall Restrictions Weight Bearing Restrictions: No      Mobility  Bed Mobility Overal bed mobility: Independent                Transfers Overall transfer level: Independent                  Ambulation/Gait Ambulation/Gait assistance: Modified independent (Device/Increase time) Ambulation Distance (Feet): 220 Feet Assistive device: None   Gait velocity: 10' walk time, 4-5 seconds without assist device   General Gait Details: reciprocal stepping pattern with symmetrical mechanics, good gait speed, good stability with dynamic gait components  Stairs            Wheelchair Mobility    Modified Rankin (Stroke Patients Only)       Balance Overall balance assessment: Modified Independent                                           Pertinent Vitals/Pain Pain Assessment: No/denies pain    Home Living Family/patient expects to be discharged to::  (Boarding home)                 Additional Comments: Resident of boarding home; multi-level facility, but patient's room/bathroom are on main level of home.  No steps required for entry/exit.    Prior Function Level of Independence: Independent         Comments: Indep with ADLs, household and community mobility; denies recent fall history.     Hand Dominance        Extremity/Trunk Assessment   Upper Extremity Assessment: Overall WFL for tasks assessed           Lower Extremity Assessment: Overall WFL for tasks assessed (patient does endorse baseline paresthesia plantar surface of bilat feet)         Communication   Communication: No difficulties  Cognition Arousal/Alertness: Awake/alert Behavior During Therapy: WFL for tasks assessed/performed Overall Cognitive Status: Within Functional Limits for tasks assessed                      General Comments      Exercises Other Exercises Other Exercises: Did verbally review foot care, foot inspection and importance of shoewear  due to bilat foot paresthesia; patient voiced understanding of all information.   Assessment/Plan    PT Assessment Patent does not need any further PT services  PT Problem List            PT Treatment Interventions      PT Goals (Current goals can be found in the Care Plan section)  Acute Rehab PT Goals Patient Stated Goal: to return home PT Goal Formulation: All assessment and education complete, DC therapy    Frequency     Barriers to discharge        Co-evaluation               End of Session Equipment Utilized During Treatment: Gait belt Activity Tolerance: Patient tolerated treatment well Patient left: in  chair;with call bell/phone within reach;with chair alarm set Nurse Communication: Mobility status         Time: [id number] PT Time Calculation (min) (ACUTE ONLY): 9 min   Charges:   PT Evaluation $PT Eval Low Complexity: 1 Procedure     PT G Codes:        Ansen Sayegh H. Manson PasseyBrown, PT, DPT, NCS 06/02/16, 11:47 AM [phone]

## 2016-06-02 NOTE — Discharge Instructions (Signed)
Soft diet for three days then can advance to solid food. Stop aspirin Stop alcohol  Upper Gastrointestinal Bleeding Upper gastrointestinal (GI) bleeding is bleeding from the swallowing tube (esophagus), stomach, or the first part of the small intestine (duodenum). If you have upper GI bleeding, you may vomit blood or have bloody or black stools. Bleeding can range from mild to serious or even life-threatening. If there is a lot of bleeding, you may need to stay in the hospital. What are the causes? This condition may be caused by:  Ulcer disease of the stomach (peptic ulcer) or duodenum. This is the most common cause of GI bleeding.  Inflammation, irritation, or swelling of the esophagus (esophagitis).  A tear in the esophagus.  Cancer of the esophagus, stomach, or duodenum.  An abnormal or weakened blood vessel in one of the upper GI structures.  A bleeding disorder that impairs the formation of blood clots and causes easy bleeding (coagulopathy). What increases the risk? The following factors may make you more likely to develop this condition:  Being older than 61 years of age.  Being male.  Having another long-term disease, especially liver or kidney disease.  Having a stomach infection caused by Helicobacter pylori bacteria.  Having frequent or severe vomiting.  Abusing alcohol.  Taking certain medicines for a long time, such as:  NSAIDs.  Anticoagulants. What are the signs or symptoms? Symptoms of this condition include:  Vomiting blood.  Black or maroon-colored stools.  Bloody stools.  Weakness or dizziness.  Heartburn.  Abdominal pain.  Difficulty swallowing.  Weight loss.  Yellow eyes or skin (jaundice).  Racing heartbeat. How is this diagnosed? This condition may be diagnosed based on:  Your symptoms and medical history.  A physical exam. During the exam, your health care provider will check for signs of blood loss, such as low blood  pressure and a rapid pulse.  Tests, such as:  Blood tests to measure your blood cell count and to check for other signs of blood loss and clotting ability.  Blood tests to check your liver and kidney function.  A chest X-ray to look for a tear in the esophagus.  Endoscopy. In this procedure, a flexible scope is put down your esophagus and into your stomach or duodenum to look for the source of bleeding.  Angiogram. This may be done if the source of bleeding is not found during endoscopy. For an angiogram, X-rays are taken after a dye is injected into your bloodstream.  Nasogastric tube insertion. This is a tube passed through your nose and down into your stomach. It may be connected to a source of gentle suction to see if any blood comes out. How is this treated? Treatment for this condition depends on the cause of the bleeding. Active bleeding is treated at the hospital. Treatment may include:  Getting fluids through an IV tube inserted into one of your veins.  Getting blood through an IV tube (blood transfusion).  Getting high doses of medicine through the IV to lower stomach acid. This may be done to treat ulcer disease.  Having endoscopy to treat an area of bleeding with high heat (coagulation), injections, or surgical clips.  Having a procedure that involves first doing an angiogram and then blocking blood flow to the bleeding site (embolization).  Stopping or changing some of your regular medicines for a certain amount of time.  Having other surgical procedures if initial treatments do not control bleeding. Follow these instructions at home:  Take  over-the-counter and prescription medicines only as told by your health care provider. You may need to avoid NSAIDs or other medicines that increase bleeding.  Do not drink alcohol.  Drink enough fluid to keep your urine clear or pale yellow.  Follow instructions from your health care provider about eating or drinking  restrictions.  Return to your normal activities as told by your health care provider. Ask your health care provider what activities are safe for you.  Do not use any tobacco products, such as cigarettes, chewing tobacco, and e-cigarettes. If you need help quitting, ask your health care provider.  Keep all follow-up visits as told by your health care provider. This is important. Contact a health care provider if:  You have abdominal pain or heartburn.  You have unexplained weight loss.  You have trouble swallowing.  You have frequent vomiting.  You develop jaundice.  You feel weak or dizzy.  You need help to stop smoking or drinking alcohol. Get help right away if:  You have vomiting with blood.  You have blood in your stools.  You have severe cramps in your back or abdomen.  Your symptoms of upper GI bleeding come back after treatment. This information is not intended to replace advice given to you by your health care provider. Make sure you discuss any questions you have with your health care provider. Document Released: 11/07/2015 Document Revised: 02/23/2016 Document Reviewed: 11/07/2015 Elsevier Interactive Patient Education  2017 [employer]

## 2016-06-02 NOTE — Discharge Planning (Signed)
IV removed.  Discharge papers given, explained and educated.  Informed of suggested FU appts and scripts [facility] to [facility].  RN assessment and VS revealed stability for DC to Home.  When ready, will be wheeled to front and family transporting via car.

## 2016-06-02 NOTE — Discharge Summary (Signed)
[facility] - [place] [facility]   PATIENT NAME: Evan Rose    MR#:  [id number]  DATE OF BIRTH:  03/24/1956  DATE OF ADMISSION:  05/30/2016 ADMITTING PHYSICIAN: Shaune PollackQing Chen, MD  DATE OF DISCHARGE: 06/02/2016 12:01 PM  PRIMARY CARE PHYSICIAN: [facility]    ADMISSION DIAGNOSIS:  Upper GI bleed [K92.2]  DISCHARGE DIAGNOSIS:  Active Problems:   GIB (gastrointestinal bleeding)   SECONDARY DIAGNOSIS:   Past Medical History:  Diagnosis Date  . Diabetes mellitus without complication (HCC)   . Hypertension     HOSPITAL COURSE:   1. Upper GI bleed. EGD showing Mallory-Weiss tear, gastritis and duodenitis. Patient was on Protonix IV during the hospital course. Patient advanced to clear liquid diet then full liquid diet. GI recommend soft diet for a few days. Protonic switched over to oral twice a day. Aspirin stopped. Patient must stop alcohol. Patient stated he had a brown bowel movement prior to going home. 2. Acute hemorrhagic anemia. Hemoglobin was 12.2 upon admission. The patient was dehydrated so this value was likely higher than his normal. Hemoglobin dropped down to as low as 8.5. Upon discharge it was up at 9.4. I did give the patient 400 mg of IV Venofer. 3. Acute kidney injury. This had improved with IV fluid hydration 4. Hypomagnesemia. This was replaced during the hospital course 5. Hypokalemia. This was replaced during the hospital course 6. Type 2 diabetes mellitus can go back on Glucophage as outpatient 7. Essential hypertension can go back on lisinopril as outpatient 8. Cardiomyopathy seen on echocardiogram. Lisinopril is a good medication for him. 9. Elevated troponin likely false positive with acute kidney injury 10. Tobacco abuse nicotine patch prescribed 11. Hyperlipidemia unspecified on atorvastatin  DISCHARGE CONDITIONS:   Satisfactory  CONSULTS OBTAINED:  Treatment Team:  Scot Junobert T Elliott, MD  DRUG ALLERGIES:  No Known  Allergies  DISCHARGE MEDICATIONS:   Discharge Medication List as of 06/02/2016 11:31 AM    START taking these medications   Details  nicotine (NICODERM CQ - DOSED IN MG/24 HOURS) 21 mg/24hr patch Place 1 patch (21 mg total) onto the skin daily., Starting Tue 06/02/2016, Print    pantoprazole (PROTONIX) 40 MG tablet Take 1 tablet (40 mg total) by mouth 2 (two) times daily., Starting Tue 06/02/2016, Print      CONTINUE these medications which have CHANGED   Details  lisinopril (PRINIVIL,ZESTRIL) 10 MG tablet Take 1 tablet (10 mg total) by mouth daily., Starting Tue 06/02/2016, Print      CONTINUE these medications which have NOT CHANGED   Details  atorvastatin (LIPITOR) 40 MG tablet Take 40 mg by mouth daily., Historical Med    metformin (FORTAMET) 1000 MG (OSM) 24 hr tablet Take 1,000 mg by mouth daily with breakfast., Historical Med      STOP taking these medications     aspirin EC 81 MG tablet          DISCHARGE INSTRUCTIONS:    Follow-up with PMD one week Follow-up with Dr. Mechele CollinElliott 2 weeks  If you experience worsening of your admission symptoms, develop shortness of breath, life threatening emergency, suicidal or homicidal thoughts you must seek medical attention immediately by calling 911 or calling your MD immediately  if symptoms less severe.  You Must read complete instructions/literature along with all the possible adverse reactions/side effects for all the Medicines you take and that have been prescribed to you. Take any new Medicines after you have completely understood and accept all  the possible adverse reactions/side effects.   Please note  You were cared for by a hospitalist during your hospital stay. If you have any questions about your discharge medications or the care you received while you were in the hospital after you are discharged, you can call the unit and asked to speak with the hospitalist on call if the hospitalist that took care of you is not  available. Once you are discharged, your primary care physician will handle any further medical issues. Please note that NO REFILLS for any discharge medications will be authorized once you are discharged, as it is imperative that you return to your primary care physician (or establish a relationship with a primary care physician if you do not have one) for your aftercare needs so that they can reassess your need for medications and monitor your lab values.    Today   CHIEF COMPLAINT:   Hematemesis  HISTORY OF PRESENT ILLNESS:  Evan Fridayorman Spickard  is a 60 y.o. male with a known history of Presented with hematemesis   VITAL SIGNS:  Blood pressure 116/72, pulse 76, temperature 97.8 F (36.6 C), resp. rate 18, height 5\' 5"  (1.651 m), weight 56.7 kg (125 lb), SpO2 100 %.    PHYSICAL EXAMINATION:  GENERAL:  60 y.o.-year-old patient lying in the bed with no acute distress.  EYES: Pupils equal, round, reactive to light and accommodation. No scleral icterus. Extraocular muscles intact.  HEENT: Head atraumatic, normocephalic. Oropharynx and nasopharynx clear.  NECK:  Supple, no jugular venous distention. No thyroid enlargement, no tenderness.  LUNGS: Normal breath sounds bilaterally, no wheezing, rales,rhonchi or crepitation. No use of accessory muscles of respiration.  CARDIOVASCULAR: S1, S2 normal. No murmurs, rubs, or gallops.  ABDOMEN: Soft, non-tender, non-distended. Bowel sounds present. No organomegaly or mass.  EXTREMITIES: No pedal edema, cyanosis, or clubbing.  NEUROLOGIC: Cranial nerves II through XII are intact. Muscle strength 5/5 in all extremities. Sensation intact. Gait not checked.  PSYCHIATRIC: The patient is alert and oriented x 3.  SKIN: No obvious rash, lesion, or ulcer.   DATA REVIEW:   CBC  Recent Labs Lab 06/01/16 0527 06/02/16 0446  WBC 7.4  --   HGB 8.5* 9.4*  HCT 25.3*  --   PLT 125*  --     Chemistries   Recent Labs Lab 05/30/16 1154  06/02/16 0446   NA 138  < > 138  K 2.9*  < > 3.7  CL 96*  < > 105  CO2 28  < > 28  GLUCOSE 242*  < > 114*  BUN 68*  < > 7  CREATININE 2.13*  < > 0.85  CALCIUM 8.9  < > 8.7*  MG  --   < > 2.4  AST 20  --   --   ALT 15*  --   --   ALKPHOS 50  --   --   BILITOT 0.9  --   --   < > = values in this interval not displayed.  Cardiac Enzymes  Recent Labs Lab 05/31/16 0054  TROPONINI 0.26*      Management plans discussed with the patient, And he is in agreement.  CODE STATUS:  Code Status History    Date Active Date Inactive Code Status Order ID Comments User Context   05/30/2016  2:53 PM 06/02/2016  3:01 PM Full Code [id number]  Shaune PollackQing Chen, MD Inpatient      TOTAL TIME TAKING CARE OF THIS PATIENT: 35 minutes.  Alford Highland M.D on 06/02/2016 at 5:31 PM  Between 7am to 6pm - Pager - [phone]  After 6pm go to [email or web address] - Social research officer, government  [employer] [facility] Office  [phone]  CC: Primary care physician; [facility]

## 2017-06-11 ENCOUNTER — Ambulatory Visit: Payer: Self-pay | Admitting: Podiatry

## 2017-06-22 ENCOUNTER — Ambulatory Visit (INDEPENDENT_AMBULATORY_CARE_PROVIDER_SITE_OTHER): Payer: Medicare Other | Admitting: Podiatry

## 2017-06-22 ENCOUNTER — Encounter: Payer: Self-pay | Admitting: Podiatry

## 2017-06-22 DIAGNOSIS — B351 Tinea unguium: Secondary | ICD-10-CM

## 2017-06-22 DIAGNOSIS — E0843 Diabetes mellitus due to underlying condition with diabetic autonomic (poly)neuropathy: Secondary | ICD-10-CM

## 2017-06-22 DIAGNOSIS — M79609 Pain in unspecified limb: Secondary | ICD-10-CM

## 2017-06-22 NOTE — Progress Notes (Signed)
   Subjective:    Patient ID: Evan Simplerorman Wynn Jr., male    DOB: 01-10-1956, 61 y.o.   MRN: [id number]  HPI    Review of Systems     Objective:   Physical Exam        Assessment & Plan:

## 2017-06-25 NOTE — Progress Notes (Signed)
   SUBJECTIVE Patient with a history of diabetes mellitus presents to office today complaining of elongated, thickened nails. Pain while ambulating in shoes. Patient is unable to trim their own nails.   Past Medical History:  Diagnosis Date  . Diabetes mellitus without complication (HCC)   . Hypertension     OBJECTIVE General Patient is awake, alert, and oriented x 3 and in no acute distress. Derm Skin is dry and supple bilateral. Negative open lesions or macerations. Remaining integument unremarkable. Nails are tender, long, thickened and dystrophic with subungual debris, consistent with onychomycosis, 1-5 bilateral. No signs of infection noted. Vasc  DP and PT pedal pulses palpable bilaterally. Temperature gradient within normal limits.  Neuro Epicritic and protective threshold sensation diminished bilaterally.  Musculoskeletal Exam No symptomatic pedal deformities noted bilateral. Muscular strength within normal limits.  ASSESSMENT 1. Diabetes Mellitus w/ peripheral neuropathy 2. Onychomycosis of nail due to dermatophyte bilateral 3. Pain in foot bilateral  PLAN OF CARE 1. Patient evaluated today. 2. Instructed to maintain good pedal hygiene and foot care. Stressed importance of controlling blood sugar.  3. Mechanical debridement of nails 1-5 bilaterally performed using a nail nipper. Filed with dremel without incident.  4. Return to clinic in 3 mos.     Felecia ShellingBrent M. Evans, DPM [facility]  Dr. Felecia ShellingBrent M. Evans, DPM    [address]                                        [place], [place] [address]                Office [phone]  Fax [phone]

## 2017-09-20 ENCOUNTER — Ambulatory Visit: Payer: Medicare Other | Admitting: Podiatry

## 2018-02-21 ENCOUNTER — Telehealth: Payer: Self-pay | Admitting: Gastroenterology

## 2018-02-21 NOTE — Telephone Encounter (Signed)
Patient LVM returning you call to schedule.

## 2018-02-28 ENCOUNTER — Encounter: Payer: Self-pay | Admitting: *Deleted

## 2018-03-08 ENCOUNTER — Encounter (INDEPENDENT_AMBULATORY_CARE_PROVIDER_SITE_OTHER): Payer: Medicare Other | Admitting: Vascular Surgery

## 2018-03-28 ENCOUNTER — Encounter (INDEPENDENT_AMBULATORY_CARE_PROVIDER_SITE_OTHER): Payer: Medicare HMO | Admitting: Vascular Surgery

## 2019-04-10 ENCOUNTER — Encounter: Payer: Self-pay | Admitting: Physician Assistant

## 2019-04-11 ENCOUNTER — Telehealth: Payer: Self-pay | Admitting: *Deleted

## 2019-04-11 NOTE — Telephone Encounter (Signed)
Received referral for low dose lung cancer screening CT scan. Message left at phone number listed in EMR for patient to call me back to facilitate scheduling scan.  

## 2019-04-12 ENCOUNTER — Telehealth: Payer: Self-pay | Admitting: *Deleted

## 2019-04-12 NOTE — Telephone Encounter (Signed)
Received referral for low dose lung cancer screening CT scan. Message left at phone number listed in EMR for patient to call me back to facilitate scheduling scan.  

## 2019-04-13 ENCOUNTER — Telehealth: Payer: Self-pay | Admitting: *Deleted

## 2019-04-13 DIAGNOSIS — Z87891 Personal history of nicotine dependence: Secondary | ICD-10-CM

## 2019-04-13 DIAGNOSIS — Z122 Encounter for screening for malignant neoplasm of respiratory organs: Secondary | ICD-10-CM

## 2019-04-13 NOTE — Telephone Encounter (Signed)
Received referral for initial lung cancer screening scan. Contacted patient and obtained smoking history,(current, 100 pack year) as well as answering questions related to screening process. Patient denies signs of lung cancer such as weight loss or hemoptysis. Patient denies comorbidity that would prevent curative treatment if lung cancer were found. Patient is scheduled for shared decision making visit and CT scan on 05/09/19 at 130pm.

## 2019-04-20 ENCOUNTER — Telehealth: Payer: Self-pay | Admitting: Gastroenterology

## 2019-04-20 NOTE — Telephone Encounter (Signed)
ERROR

## 2019-04-21 ENCOUNTER — Ambulatory Visit: Payer: Medicare HMO | Admitting: Podiatry

## 2019-04-25 ENCOUNTER — Other Ambulatory Visit: Payer: Self-pay

## 2019-04-25 ENCOUNTER — Telehealth: Payer: Self-pay | Admitting: Gastroenterology

## 2019-04-25 ENCOUNTER — Ambulatory Visit (INDEPENDENT_AMBULATORY_CARE_PROVIDER_SITE_OTHER): Payer: Medicare Other | Admitting: Podiatry

## 2019-04-25 ENCOUNTER — Encounter: Payer: Self-pay | Admitting: *Deleted

## 2019-04-25 DIAGNOSIS — E0843 Diabetes mellitus due to underlying condition with diabetic autonomic (poly)neuropathy: Secondary | ICD-10-CM

## 2019-04-25 DIAGNOSIS — B351 Tinea unguium: Secondary | ICD-10-CM | POA: Diagnosis not present

## 2019-04-25 DIAGNOSIS — M79676 Pain in unspecified toe(s): Secondary | ICD-10-CM

## 2019-04-25 MED ORDER — GABAPENTIN 300 MG PO CAPS: 300.0000 mg | ORAL_CAPSULE | Freq: Three times a day (TID) | ORAL | 3 refills | Status: DC

## 2019-04-25 NOTE — Telephone Encounter (Signed)
A lady returned call for patient to schedule colonoscopy. The would like to schedule it 11-2 & 11-3 (or on a Monday or a Tuesday when he has transportation).

## 2019-04-26 NOTE — Telephone Encounter (Signed)
LVM for lady caller to call office back in regards to scheduling patients colonoscopy.  Thanks [employer]

## 2019-04-28 NOTE — Progress Notes (Signed)
   SUBJECTIVE Patient with a history of diabetes mellitus presents to office today complaining of elongated, thickened nails that cause pain while ambulating in shoes. He is unable to trim his own nails. Patient is here for further evaluation and treatment.   Past Medical History:  Diagnosis Date  . Diabetes mellitus without complication ([place])   . Hypertension     OBJECTIVE General Patient is awake, alert, and oriented x 3 and in no acute distress. Derm Skin is dry and supple bilateral. Negative open lesions or macerations. Remaining integument unremarkable. Nails are tender, long, thickened and dystrophic with subungual debris, consistent with onychomycosis, 1-5 bilateral. No signs of infection noted. Vasc  DP and PT pedal pulses palpable bilaterally. Temperature gradient within normal limits.  Neuro Epicritic and protective threshold sensation diminished bilaterally.  Musculoskeletal Exam No symptomatic pedal deformities noted bilateral. Muscular strength within normal limits.  ASSESSMENT 1. Diabetes Mellitus w/ peripheral neuropathy 2. Onychomycosis of nail due to dermatophyte bilateral 3. Pain in foot bilateral  PLAN OF CARE 1. Patient evaluated today. 2. Instructed to maintain good pedal hygiene and foot care. Stressed importance of controlling blood sugar.  3. Mechanical debridement of nails 1-5 bilaterally performed using a nail nipper. Filed with dremel without incident.  4. Prescription for Gabapentin 300 mg TID #90 with 3 refills provided to patient.  5. Return to clinic as needed.     Edrick Kins, DPM [facility]  Dr. Edrick Kins, [address]                                        [place], [place] [address]                Office [phone]  Fax [phone]

## 2019-05-07 DIAGNOSIS — I739 Peripheral vascular disease, unspecified: Secondary | ICD-10-CM | POA: Insufficient documentation

## 2019-05-07 DIAGNOSIS — E785 Hyperlipidemia, unspecified: Secondary | ICD-10-CM | POA: Insufficient documentation

## 2019-05-07 DIAGNOSIS — E119 Type 2 diabetes mellitus without complications: Secondary | ICD-10-CM | POA: Insufficient documentation

## 2019-05-07 DIAGNOSIS — I10 Essential (primary) hypertension: Secondary | ICD-10-CM | POA: Insufficient documentation

## 2019-05-07 NOTE — Progress Notes (Signed)
MRN : [id number]  Evan Rose. is a 63 y.o. (Apr 06, 1956) male who presents with chief complaint of No chief complaint on file. Marland Kitchen  History of Present Illness:  The patient is seen for evaluation of painful lower extremities. Patient notes the pain is variable and not always associated with activity.  The pain is somewhat consistent day to day occurring on most days. The patient notes the pain also occurs with standing and routinely seems worse as the day wears on. The pain has been progressive over the past several years. The patient states these symptoms are causing  a profound negative impact on quality of life and daily activities.  The patient denies rest pain or dangling of an extremity off the side of the bed during the night for relief. No open wounds or sores at this time. No history of DVT or phlebitis. No prior interventions or surgeries.  There is a  history of back problems and DJD of the lumbar and sacral spine.    No outpatient medications have been marked as taking for the 05/08/19 encounter (Appointment) with Gilda Crease, Latina Craver, MD.    Past Medical History:  Diagnosis Date  . Diabetes mellitus without complication (HCC)   . Hypertension     Past Surgical History:  Procedure Laterality Date  . COLONOSCOPY    . ESOPHAGOGASTRODUODENOSCOPY (EGD) WITH PROPOFOL N/A 06/01/2016   Procedure: ESOPHAGOGASTRODUODENOSCOPY (EGD) WITH PROPOFOL;  Surgeon: Scot Jun, MD;  Location: [facility] END[facility]COPY;  Service: Endoscopy;  Laterality: N/A;    Social History Social History   Tobacco Use  . Smoking status: Current Every Day Smoker    Packs/day: 2.00    Years: 50.00    Pack years: 100.00  . Smokeless tobacco: Never Used  Substance Use Topics  . Alcohol use: Yes    Comment: last drink liquor on thursday  . Drug use: No    Family History Family History  Problem Relation Age of Onset  . Diabetes Sister   No family history of bleeding/clotting disorders, porphyria  or autoimmune disease   No Known Allergies   REVIEW OF SYSTEMS (Negative unless checked)  Constitutional: [] Weight loss  [] Fever  [] Chills Cardiac: [] Chest pain   [] Chest pressure   [] Palpitations   [] Shortness of breath when laying flat   [] Shortness of breath with exertion. Vascular:  [x] Pain in legs with walking   [x] Pain in legs at rest  [] History of DVT   [] Phlebitis   [] Swelling in legs   [] Varicose veins   [] Non-healing ulcers Pulmonary:   [] Uses home oxygen   [] Productive cough   [] Hemoptysis   [] Wheeze  [] COPD   [] Asthma Neurologic:  [] Dizziness   [] Seizures   [] History of stroke   [] History of TIA  [] Aphasia   [] Vissual changes   [] Weakness or numbness in arm   [] Weakness or numbness in leg Musculoskeletal:   [] Joint swelling   [] Joint pain   [] Low back pain Hematologic:  [] Easy bruising  [] Easy bleeding   [] Hypercoagulable state   [] Anemic Gastrointestinal:  [] Diarrhea   [] Vomiting  [] Gastroesophageal reflux/heartburn   [] Difficulty swallowing. Genitourinary:  [] Chronic kidney disease   [] Difficult urination  [] Frequent urination   [] Blood in urine Skin:  [] Rashes   [] Ulcers  Psychological:  [] History of anxiety   []  History of major depression.  Physical Examination  There were no vitals filed for this visit. There is no height or weight on file to calculate BMI. Gen: WD/WN, NAD Head: [place]/AT, No temporalis wasting.  Ear/Nose/Throat: Hearing grossly intact, nares w/o erythema or drainage, poor dentition Eyes: PER, EOMI, sclera nonicteric.  Neck: Supple, no masses.  No bruit or JVD.  Pulmonary:  Good air movement, clear to auscultation bilaterally, no use of accessory muscles.  Cardiac: RRR, normal S1, S2, no Murmurs. Vascular: left carotid bruit Vessel Right Left  Radial Palpable Palpable  PT Not Palpable Not Palpable  DP Not Palpable Not Palpable  Gastrointestinal: soft, non-distended. No guarding/no peritoneal signs.  Musculoskeletal: M/S 5/5 throughout.  No deformity  or atrophy.  Neurologic: CN 2-12 intact. Pain and light touch intact in extremities.  Symmetrical.  Speech is fluent. Motor exam as listed above. Psychiatric: Judgment intact, Mood & affect appropriate for pt's clinical situation. Dermatologic: No rashes or ulcers noted.  No changes consistent with cellulitis. Lymph : No Cervical lymphadenopathy, no lichenification or skin changes of chronic lymphedema.  CBC Lab Results  Component Value Date   WBC 7.4 06/01/2016   HGB 9.4 (L) 06/02/2016   HCT 25.3 (L) 06/01/2016   MCV 98.5 06/01/2016   PLT 125 (L) 06/01/2016    BMET    Component Value Date/Time   NA 138 06/02/2016 0446   K 3.7 06/02/2016 0446   CL 105 06/02/2016 0446   CO2 28 06/02/2016 0446   GLUCOSE 114 (H) 06/02/2016 0446   BUN 7 06/02/2016 0446   CREATININE 0.85 06/02/2016 0446   CALCIUM 8.7 (L) 06/02/2016 0446   GFRNONAA >60 06/02/2016 0446   GFRAA >60 06/02/2016 0446   CrCl cannot be calculated (Patient's most recent lab result is older than the maximum 21 days allowed.).  COAG Lab Results  Component Value Date   INR 0.99 05/30/2016    Radiology No results found.   Assessment/Plan 1. PAD (peripheral artery disease) (HCC) Recommend:  Patient should undergo arterial duplex of the lower extremity ASAP because there has been a significant deterioration in the patient's lower extremity symptoms.  The patient states they are having increased pain and a marked decrease in the distance that they can walk.  The risks and benefits as well as the alternatives were discussed in detail with the patient.  All questions were answered.  Patient agrees to proceed and understands this could be a prelude to angiography and intervention.  The patient will follow up with me in the office to review the studies.  - VAS [place] LOWER EXTREMITY ARTERIAL DUPLEX; Future - VAS [place] ABI WITH/WO TBI; Future  2. Left carotid bruit Recommend:  Given the patient's asymptomatic subcritical  stenosis no further invasive testing or surgery at this time.  Duplex ultrasound should be obtained because of the bruit.  Continue antiplatelet therapy as prescribed Continue management of CAD, HTN and Hyperlipidemia Healthy heart diet,  encouraged exercise at least 4 times per week Follow up in 1 month with duplex ultrasound and physical exam  - VAS US CAROTID; Future  3. Essential hypertension Continue antihypertensive medications as already ordered, these medications have been reviewed and there are no changes at this time.   4. Mixed hyperlipidemia Continue statin as ordered and reviewed, no changes at this time   5. Type 2 diabetes mellitus without complication, without long-term current use of insulin (HCC) Continue hypoglycemic medications as already ordered, these medications have been reviewed and there are no changes at this time.  Hgb A1C to be monitored as already arranged by primary service    Hortencia Pilar, MD  05/07/2019 11:51 AM

## 2019-05-08 ENCOUNTER — Encounter (INDEPENDENT_AMBULATORY_CARE_PROVIDER_SITE_OTHER): Payer: Self-pay | Admitting: Vascular Surgery

## 2019-05-08 ENCOUNTER — Ambulatory Visit (INDEPENDENT_AMBULATORY_CARE_PROVIDER_SITE_OTHER): Payer: Medicare Other | Admitting: Vascular Surgery

## 2019-05-08 ENCOUNTER — Other Ambulatory Visit: Payer: Self-pay

## 2019-05-08 DIAGNOSIS — I739 Peripheral vascular disease, unspecified: Secondary | ICD-10-CM | POA: Diagnosis not present

## 2019-05-08 DIAGNOSIS — E782 Mixed hyperlipidemia: Secondary | ICD-10-CM | POA: Diagnosis not present

## 2019-05-08 DIAGNOSIS — R0989 Other specified symptoms and signs involving the circulatory and respiratory systems: Secondary | ICD-10-CM | POA: Insufficient documentation

## 2019-05-08 DIAGNOSIS — E119 Type 2 diabetes mellitus without complications: Secondary | ICD-10-CM

## 2019-05-08 DIAGNOSIS — I10 Essential (primary) hypertension: Secondary | ICD-10-CM

## 2019-05-09 ENCOUNTER — Inpatient Hospital Stay: Payer: Medicare Other | Attending: Oncology | Admitting: Nurse Practitioner

## 2019-05-09 ENCOUNTER — Other Ambulatory Visit: Payer: Self-pay

## 2019-05-09 ENCOUNTER — Ambulatory Visit
Admission: RE | Admit: 2019-05-09 | Discharge: 2019-05-09 | Disposition: A | Discharge status: Home / Self Care | Payer: Medicare Other | Source: Ambulatory Visit | Attending: Oncology | Admitting: Oncology

## 2019-05-09 DIAGNOSIS — F17210 Nicotine dependence, cigarettes, uncomplicated: Secondary | ICD-10-CM | POA: Diagnosis not present

## 2019-05-09 DIAGNOSIS — Z122 Encounter for screening for malignant neoplasm of respiratory organs: Secondary | ICD-10-CM | POA: Insufficient documentation

## 2019-05-09 DIAGNOSIS — Z87891 Personal history of nicotine dependence: Secondary | ICD-10-CM | POA: Insufficient documentation

## 2019-05-09 NOTE — Progress Notes (Signed)
Virtual Visit via Video Enabled Telemedicine Note   I connected with Evan Rose. on 05/09/19 at 1:30 PM EST by video enabled telemedicine visit and verified that I am speaking with the correct person using two identifiers.   I discussed the limitations, risks, security and privacy concerns of performing an evaluation and management service by telemedicine and the availability of in-person appointments. I also discussed with the patient that there may be a patient responsible charge related to this service. The patient expressed understanding and agreed to proceed.   Other persons participating in the visit and their role in the encounter: Burgess Estelle, RN- checking in patient & navigation  Patient's location: [place]  Provider's location: Clinic  Chief Complaint: Low Dose CT Screening  Patient agreed to evaluation by telemedicine to discuss shared decision making for consideration of low dose CT lung cancer screening.    In accordance with CMS guidelines, patient has met eligibility criteria including age, absence of signs or symptoms of lung cancer.  Social History   Tobacco Use  . Smoking status: Current Every Day Smoker    Packs/day: 2.00    Years: 50.00    Pack years: 100.00  . Smokeless tobacco: Never Used  Substance Use Topics  . Alcohol use: Yes    Comment: last drink liquor on thursday     A shared decision-making session was conducted prior to the performance of CT scan. This includes one or more decision aids, includes benefits and harms of screening, follow-up diagnostic testing, over-diagnosis, false positive rate, and total radiation exposure.   Counseling on the importance of adherence to annual lung cancer LDCT screening, impact of co-morbidities, and ability or willingness to undergo diagnosis and treatment is imperative for compliance of the program.   Counseling on the importance of continued smoking cessation for former smokers; the importance of  smoking cessation for current smokers, and information about tobacco cessation interventions have been given to patient including [place] and [employer] Quit  programs.   Written order for lung cancer screening with LDCT has been given to the patient and any and all questions have been answered to the best of my abilities.    Yearly follow up will be coordinated by Burgess Estelle, Thoracic Navigator.  I discussed the assessment and treatment plan with the patient. The patient was provided an opportunity to ask questions and all were answered. The patient agreed with the plan and demonstrated an understanding of the instructions.   The patient was advised to call back or seek an in-person evaluation if the symptoms worsen or if the condition fails to improve as anticipated.   I provided 15 minutes of face-to-face video visit time during this encounter, and > 50% was spent counseling as documented under my assessment & plan.   Beckey Rutter, DNP, AGNP-C [place] at [facility] [phone] (work cell) [phone] (office)

## 2019-05-11 ENCOUNTER — Encounter: Payer: Self-pay | Admitting: *Deleted

## 2019-05-29 ENCOUNTER — Ambulatory Visit (INDEPENDENT_AMBULATORY_CARE_PROVIDER_SITE_OTHER): Payer: Medicare Other | Admitting: Nurse Practitioner

## 2019-05-29 ENCOUNTER — Ambulatory Visit (INDEPENDENT_AMBULATORY_CARE_PROVIDER_SITE_OTHER): Payer: Medicare Other

## 2019-05-29 ENCOUNTER — Encounter (INDEPENDENT_AMBULATORY_CARE_PROVIDER_SITE_OTHER): Payer: Self-pay | Admitting: Nurse Practitioner

## 2019-05-29 ENCOUNTER — Other Ambulatory Visit: Payer: Self-pay

## 2019-05-29 ENCOUNTER — Encounter (INDEPENDENT_AMBULATORY_CARE_PROVIDER_SITE_OTHER): Payer: Self-pay

## 2019-05-29 VITALS — BP 131/81 | HR 69 | Resp 16 | Wt 141.8 lb

## 2019-05-29 DIAGNOSIS — R0989 Other specified symptoms and signs involving the circulatory and respiratory systems: Secondary | ICD-10-CM

## 2019-05-29 DIAGNOSIS — I739 Peripheral vascular disease, unspecified: Secondary | ICD-10-CM

## 2019-05-29 DIAGNOSIS — I10 Essential (primary) hypertension: Secondary | ICD-10-CM

## 2019-05-29 DIAGNOSIS — E782 Mixed hyperlipidemia: Secondary | ICD-10-CM | POA: Diagnosis not present

## 2019-05-30 ENCOUNTER — Telehealth (INDEPENDENT_AMBULATORY_CARE_PROVIDER_SITE_OTHER): Payer: Self-pay

## 2019-05-30 ENCOUNTER — Encounter (INDEPENDENT_AMBULATORY_CARE_PROVIDER_SITE_OTHER): Payer: Self-pay

## 2019-05-30 NOTE — Telephone Encounter (Signed)
I attempted to contact Evan Rose for the patient, a message was left for a return call. Patient is scheduled with Dr. Delana Meyer for angio on 06/07/2019 with a 9:00 am arrival time to the [facility]. Patient will do covid testing on 06/05/2019 between 12:30-2:30 pm at the [place]. Pre-procedure information will be mailed to the patient.

## 2019-05-31 NOTE — Telephone Encounter (Signed)
Hilda Blades the patient's caregiver returned the call and the procedure information along with pre-procedure instructions were discussed.

## 2019-06-05 ENCOUNTER — Encounter (INDEPENDENT_AMBULATORY_CARE_PROVIDER_SITE_OTHER): Payer: Self-pay | Admitting: Nurse Practitioner

## 2019-06-05 ENCOUNTER — Other Ambulatory Visit
Admission: RE | Admit: 2019-06-05 | Discharge: 2019-06-05 | Disposition: A | Discharge status: Home / Self Care | Payer: Medicare Other | Source: Ambulatory Visit | Attending: Vascular Surgery | Admitting: Vascular Surgery

## 2019-06-05 ENCOUNTER — Other Ambulatory Visit: Payer: Self-pay

## 2019-06-05 DIAGNOSIS — Z01812 Encounter for preprocedural laboratory examination: Secondary | ICD-10-CM | POA: Diagnosis present

## 2019-06-05 DIAGNOSIS — Z20828 Contact with and (suspected) exposure to other viral communicable diseases: Secondary | ICD-10-CM | POA: Insufficient documentation

## 2019-06-05 NOTE — Progress Notes (Signed)
SUBJECTIVE:  Patient ID: Evan Rose., male    DOB: 25-Jan-1956, 63 y.o.   MRN: [id number] Chief Complaint  Patient presents with  . Follow-up    ultrasound follow up    HPI  Evan Rose. is a 63 y.o. male The patient returns to the office for followup and review of the noninvasive studies. There has been a significant deterioration in the lower extremity symptoms.  The patient notes interval shortening of their claudication distance and development of mild rest pain symptoms. No new ulcers or wounds have occurred since the last visit.  There have been no significant changes to the patient's overall health care.  The patient denies amaurosis fugax or recent TIA symptoms. There are no recent neurological changes noted. The patient denies history of DVT, PE or superficial thrombophlebitis. The patient denies recent episodes of angina or shortness of breath.   ABI's Rt=1.15 and Lt=0.83 (No Previous) Duplex US of the lower extremity arterial system shows biphasic waveforms down to the level of the tibial arteries where the anterior tibial artery has triphasic waveforms.  The distal peroneal is occluded and the distal posterior tibial artery has monophasic waveforms with a systolic pressure of 8.  The left lower extremity has biphasic waveforms down to the tibial arteries.  The distal peroneal artery is monophasic waveforms.  Past Medical History:  Diagnosis Date  . Diabetes mellitus without complication ([place])   . Hypertension     Past Surgical History:  Procedure Laterality Date  . COLONOSCOPY    . ESOPHAGOGASTRODUODENOSCOPY (EGD) WITH PROPOFOL N/A 06/01/2016   Procedure: ESOPHAGOGASTRODUODENOSCOPY (EGD) WITH PROPOFOL;  Surgeon: Manya Silvas, MD;  Location: [facility] ENDOSCOPY;  Service: Endoscopy;  Laterality: N/A;    Social History   Socioeconomic History  . Marital status: Single    Spouse name: Not on file  . Number of children: Not on file  . Years of education:  Not on file  . Highest education level: Not on file  Occupational History  . Not on file  Social Needs  . Financial resource strain: Not on file  . Food insecurity    Worry: Not on file    Inability: Not on file  . Transportation needs    Medical: Not on file    Non-medical: Not on file  Tobacco Use  . Smoking status: Current Every Day Smoker    Packs/day: 2.00    Years: 50.00    Pack years: 100.00  . Smokeless tobacco: Never Used  Substance and Sexual Activity  . Alcohol use: Yes    Comment: last drink liquor on thursday  . Drug use: No  . Sexual activity: Not on file  Lifestyle  . Physical activity    Days per week: Not on file    Minutes per session: Not on file  . Stress: Not on file  Relationships  . Social Herbalist on phone: Not on file    Gets together: Not on file    Attends religious service: Not on file    Active member of club or organization: Not on file    Attends meetings of clubs or organizations: Not on file    Relationship status: Not on file  . Intimate partner violence    Fear of current or ex partner: Not on file    Emotionally abused: Not on file    Physically abused: Not on file    Forced sexual activity: Not on file  Other Topics Concern  .  Not on file  Social History Narrative  . Not on file    Family History  Problem Relation Age of Onset  . Diabetes Sister     No Known Allergies   Review of Systems   Review of Systems: Negative Unless Checked Constitutional: Weight loss  Fever  Chills Cardiac: Chest pain    Atrial Fibrillation  Palpitations   Shortness of breath when laying flat   Shortness of breath with exertion. Shortness of breath at rest Vascular:  Pain in legs with walking   Pain in legs with standing Pain in legs when laying flat   Claudication    Pain in feet when laying flat    History of DVT   Phlebitis   Swelling in legs   Varicose veins   Non-healing ulcers Pulmonary:    Uses home oxygen   Productive cough   Hemoptysis   Wheeze  COPD   Asthma Neurologic:  Dizziness   Seizures  Blackouts History of stroke   History of TIA  Aphasia   Temporary Blindness   Weakness or numbness in arm   Weakness or numbness in leg Musculoskeletal:   Joint swelling   Joint pain   Low back pain   History of Knee Replacement Arthritis back Surgeries   Spinal Stenosis    Hematologic:  Easy bruising  Easy bleeding   Hypercoagulable state   Anemic Gastrointestinal:  Diarrhea   Vomiting  Gastroesophageal reflux/heartburn   Difficulty swallowing. Abdominal pain Genitourinary:  Chronic kidney disease   Difficult urination  Anuric   Blood in urine Frequent urination  Burning with urination   Hematuria Skin:  Rashes   Ulcers Wounds Psychological:  History of anxiety    History of major depression   Memory Difficulties      OBJECTIVE:   Physical Exam  BP 131/81 (BP Location: Right Arm)   Pulse 69   Resp 16   Wt 141 lb 13.6 oz (64.3 kg)   BMI 23.61 kg/m   Gen: WD/WN, NAD Head: [place]/AT, No temporalis wasting.  Ear/Nose/Throat: Hearing grossly intact, nares w/o erythema or drainage Eyes: PER, EOMI, sclera nonicteric.  Neck: Supple, no masses.  No JVD.  Pulmonary:  Good air movement, no use of accessory muscles.  Cardiac: RRR Vascular:  Vessel Right Left  Dorsalis Pedis Not Palpable Not Palpable  Posterior Tibial Not Palpable Not Palpable   Gastrointestinal: soft, non-distended. No guarding/no peritoneal signs.  Musculoskeletal: M/S 5/5 throughout.  No deformity or atrophy.  Neurologic: Pain and light touch intact in extremities.  Symmetrical.  Speech is fluent. Motor exam as listed above. Psychiatric: Judgment intact, Mood & affect appropriate for pt's clinical situation. Dermatologic: No Venous rashes. No Ulcers Noted.  No changes consistent with cellulitis. Lymph : No Cervical  lymphadenopathy, no lichenification or skin changes of chronic lymphedema.       ASSESSMENT AND PLAN:  1. PAD (peripheral artery disease) (HCC)  Recommend:  The patient has experienced increased symptoms and is now describing lifestyle limiting claudication and mild rest pain.   Given the severity of the patient's lower extremity symptoms the patient should undergo angiography and intervention.  Risk and benefits were reviewed the patient.  Indications for the procedure were reviewed.  All questions were answered, the patient agrees to proceed.   The patient should continue walking and begin a more formal exercise program.  The patient should continue antiplatelet therapy and aggressive treatment of the lipid abnormalities  The patient will follow up with me  after the angiogram.   2. Left carotid bruit Recommend:  Given the patient's asymptomatic subcritical stenosis no further invasive testing or surgery at this time.  Duplex ultrasound shows 1-39% stenosis bilaterally.  Continue antiplatelet therapy as prescribed Continue management of CAD, HTN and Hyperlipidemia Healthy heart diet,  encouraged exercise at least 4 times per week Follow up in 12 months with duplex ultrasound and physical exam   3. Essential hypertension Continue antihypertensive medications as already ordered, these medications have been reviewed and there are no changes at this time.   4. Mixed hyperlipidemia Continue statin as ordered and reviewed, no changes at this time    Current Outpatient Medications on File Prior to Visit  Medication Sig Dispense Refill  . atorvastatin (LIPITOR) 40 MG tablet Take 40 mg by mouth daily.    Marland Kitchen gabapentin (NEURONTIN) 300 MG capsule Take 1 capsule (300 mg total) by mouth 3 (three) times daily. 90 capsule 3  . lisinopril (PRINIVIL,ZESTRIL) 10 MG tablet Take 1 tablet (10 mg total) by mouth daily. 30 tablet 0  . metformin (FORTAMET) 1000 MG (OSM) 24 hr tablet Take 1,000  mg by mouth daily with breakfast.    . pantoprazole (PROTONIX) 40 MG tablet Take 1 tablet (40 mg total) by mouth 2 (two) times daily. 60 tablet 0  . nicotine (NICODERM CQ - DOSED IN MG/24 HOURS) 21 mg/24hr patch Place 1 patch (21 mg total) onto the skin daily. (Patient not taking: Reported on 05/08/2019) 28 patch 0   No current facility-administered medications on file prior to visit.     There are no Patient Instructions on file for this visit. No follow-ups on file.   Georgiana Spinner, NP  This note was completed with Office manager.  Any errors are purely unintentional.

## 2019-06-06 ENCOUNTER — Other Ambulatory Visit (INDEPENDENT_AMBULATORY_CARE_PROVIDER_SITE_OTHER): Payer: Self-pay | Admitting: Nurse Practitioner

## 2019-06-06 LAB — SARS CORONAVIRUS 2 (TAT 6-24 HRS): SARS Coronavirus 2: NEGATIVE

## 2019-06-07 ENCOUNTER — Encounter
Admission: RE | Disposition: A | Discharge status: Home / Self Care | Payer: Self-pay | Source: Home / Self Care | Attending: Vascular Surgery

## 2019-06-07 ENCOUNTER — Ambulatory Visit
Admission: RE | Admit: 2019-06-07 | Discharge: 2019-06-07 | Disposition: A | Discharge status: Home / Self Care | Payer: Medicare Other | Attending: Vascular Surgery | Admitting: Vascular Surgery

## 2019-06-07 ENCOUNTER — Encounter: Payer: Self-pay | Admitting: Certified Registered Nurse Anesthetist

## 2019-06-07 ENCOUNTER — Other Ambulatory Visit: Payer: Self-pay

## 2019-06-07 DIAGNOSIS — I10 Essential (primary) hypertension: Secondary | ICD-10-CM | POA: Diagnosis not present

## 2019-06-07 DIAGNOSIS — E1151 Type 2 diabetes mellitus with diabetic peripheral angiopathy without gangrene: Secondary | ICD-10-CM | POA: Diagnosis not present

## 2019-06-07 DIAGNOSIS — Z79899 Other long term (current) drug therapy: Secondary | ICD-10-CM | POA: Diagnosis not present

## 2019-06-07 DIAGNOSIS — Z7984 Long term (current) use of oral hypoglycemic drugs: Secondary | ICD-10-CM | POA: Diagnosis not present

## 2019-06-07 DIAGNOSIS — I70221 Atherosclerosis of native arteries of extremities with rest pain, right leg: Secondary | ICD-10-CM | POA: Insufficient documentation

## 2019-06-07 DIAGNOSIS — Z833 Family history of diabetes mellitus: Secondary | ICD-10-CM | POA: Insufficient documentation

## 2019-06-07 DIAGNOSIS — R0989 Other specified symptoms and signs involving the circulatory and respiratory systems: Secondary | ICD-10-CM | POA: Diagnosis not present

## 2019-06-07 DIAGNOSIS — F17210 Nicotine dependence, cigarettes, uncomplicated: Secondary | ICD-10-CM | POA: Insufficient documentation

## 2019-06-07 DIAGNOSIS — I739 Peripheral vascular disease, unspecified: Secondary | ICD-10-CM

## 2019-06-07 DIAGNOSIS — E782 Mixed hyperlipidemia: Secondary | ICD-10-CM | POA: Insufficient documentation

## 2019-06-07 DIAGNOSIS — I70212 Atherosclerosis of native arteries of extremities with intermittent claudication, left leg: Secondary | ICD-10-CM | POA: Diagnosis not present

## 2019-06-07 HISTORY — PX: LOWER EXTREMITY ANGIOGRAPHY: CATH118251

## 2019-06-07 HISTORY — DX: Peripheral vascular disease, unspecified: I73.9

## 2019-06-07 LAB — BUN: BUN: 15 mg/dL (ref 8–23)

## 2019-06-07 LAB — CREATININE, SERUM
Creatinine, Ser: 0.99 mg/dL (ref 0.61–1.24)
GFR calc Af Amer: >60 mL/min (ref >60)
GFR calc non Af Amer: >60 mL/min (ref >60)

## 2019-06-07 LAB — GLUCOSE, CAPILLARY
Glucose-Capillary: 203 mg/dL — ABNORMAL HIGH (ref 70–99)
Glucose-Capillary: 135 mg/dL — ABNORMAL HIGH (ref 70–99)

## 2019-06-07 SURGERY — LOWER EXTREMITY ANGIOGRAPHY
Anesthesia: Moderate Sedation | Site: Leg Lower | Laterality: Right

## 2019-06-07 MED ORDER — ONDANSETRON HCL 4 MG/2ML IJ SOLN: 4.0000 mg | Freq: Four times a day (QID) | INTRAMUSCULAR | Status: DC | PRN

## 2019-06-07 MED ORDER — ACETAMINOPHEN 325 MG PO TABS: 650.0000 mg | ORAL_TABLET | ORAL | Status: DC | PRN

## 2019-06-07 MED ORDER — HEPARIN SODIUM (PORCINE) 1000 UNIT/ML IJ SOLN
INTRAMUSCULAR | Status: AC
  Filled 2019-06-07: qty 1

## 2019-06-07 MED ORDER — HEPARIN SODIUM (PORCINE) 1000 UNIT/ML IJ SOLN
INTRAMUSCULAR | Status: DC | PRN
  Administered 2019-06-07: 5000 [IU] via INTRAVENOUS

## 2019-06-07 MED ORDER — CEFAZOLIN SODIUM-DEXTROSE 2-4 GM/100ML-% IV SOLN
2.0000 g | Freq: Once | INTRAVENOUS | Status: AC
  Administered 2019-06-07: 2 g via INTRAVENOUS

## 2019-06-07 MED ORDER — LABETALOL HCL 5 MG/ML IV SOLN
INTRAVENOUS | Status: AC
  Filled 2019-06-07: qty 4

## 2019-06-07 MED ORDER — CLOPIDOGREL BISULFATE 75 MG PO TABS: 75.0000 mg | ORAL_TABLET | Freq: Every day | ORAL | 4 refills | Status: AC

## 2019-06-07 MED ORDER — FAMOTIDINE 20 MG PO TABS: 40.0000 mg | ORAL_TABLET | Freq: Once | ORAL | Status: DC | PRN

## 2019-06-07 MED ORDER — SODIUM CHLORIDE 0.9 % IV SOLN: INTRAVENOUS | Status: DC

## 2019-06-07 MED ORDER — MORPHINE SULFATE (PF) 4 MG/ML IV SOLN: 2.0000 mg | INTRAVENOUS | Status: DC | PRN

## 2019-06-07 MED ORDER — MIDAZOLAM HCL 2 MG/2ML IJ SOLN
INTRAMUSCULAR | Status: DC | PRN
  Administered 2019-06-07: 1 mg via INTRAVENOUS
  Administered 2019-06-07: 2 mg via INTRAVENOUS
  Administered 2019-06-07: 1 mg via INTRAVENOUS

## 2019-06-07 MED ORDER — FENTANYL CITRATE (PF) 100 MCG/2ML IJ SOLN
INTRAMUSCULAR | Status: AC
  Filled 2019-06-07: qty 2

## 2019-06-07 MED ORDER — LABETALOL HCL 5 MG/ML IV SOLN
10.0000 mg | INTRAVENOUS | Status: DC | PRN
  Administered 2019-06-07: 10 mg via INTRAVENOUS

## 2019-06-07 MED ORDER — CEFAZOLIN SODIUM-DEXTROSE 2-4 GM/100ML-% IV SOLN
INTRAVENOUS | Status: AC
  Filled 2019-06-07: qty 100

## 2019-06-07 MED ORDER — SODIUM CHLORIDE 0.9 % IV SOLN: 250.0000 mL | INTRAVENOUS | Status: DC | PRN

## 2019-06-07 MED ORDER — HYDRALAZINE HCL 20 MG/ML IJ SOLN: 5.0000 mg | INTRAMUSCULAR | Status: DC | PRN

## 2019-06-07 MED ORDER — FENTANYL CITRATE (PF) 100 MCG/2ML IJ SOLN
INTRAMUSCULAR | Status: DC | PRN
  Administered 2019-06-07: 25 ug via INTRAVENOUS
  Administered 2019-06-07 (×2): 50 ug via INTRAVENOUS

## 2019-06-07 MED ORDER — SODIUM CHLORIDE 0.9 % IV SOLN
INTRAVENOUS | Status: DC
  Administered 2019-06-07: 10:00:00 via INTRAVENOUS

## 2019-06-07 MED ORDER — SODIUM CHLORIDE 0.9% FLUSH: 3.0000 mL | INTRAVENOUS | Status: DC | PRN

## 2019-06-07 MED ORDER — CLOPIDOGREL BISULFATE 300 MG PO TABS: 300.0000 mg | ORAL_TABLET | ORAL | Status: DC

## 2019-06-07 MED ORDER — HYDROMORPHONE HCL 1 MG/ML IJ SOLN: 1.0000 mg | Freq: Once | INTRAMUSCULAR | Status: DC | PRN

## 2019-06-07 MED ORDER — OXYCODONE HCL 5 MG PO TABS: 5.0000 mg | ORAL_TABLET | ORAL | Status: DC | PRN

## 2019-06-07 MED ORDER — MIDAZOLAM HCL 5 MG/5ML IJ SOLN
INTRAMUSCULAR | Status: AC
  Filled 2019-06-07: qty 5

## 2019-06-07 MED ORDER — METHYLPREDNISOLONE SODIUM SUCC 125 MG IJ SOLR: 125.0000 mg | Freq: Once | INTRAMUSCULAR | Status: DC | PRN

## 2019-06-07 MED ORDER — MIDAZOLAM HCL 2 MG/ML PO SYRP: 8.0000 mg | ORAL_SOLUTION | Freq: Once | ORAL | Status: DC | PRN

## 2019-06-07 MED ORDER — SODIUM CHLORIDE 0.9% FLUSH: 3.0000 mL | Freq: Two times a day (BID) | INTRAVENOUS | Status: DC

## 2019-06-07 MED ORDER — DIPHENHYDRAMINE HCL 50 MG/ML IJ SOLN: 50.0000 mg | Freq: Once | INTRAMUSCULAR | Status: DC | PRN

## 2019-06-07 SURGICAL SUPPLY — 27 items
BALLN LUTONIX 018 4X40X130 (BALLOONS) ×2
BALLN LUTONIX DCB 5X80X130 (BALLOONS) ×2
BALLN LUTONIX DCB 6X100X130 (BALLOONS) ×4
BALLN LUTONIX DCB 6X60X130 (BALLOONS) ×2
BALLN ULTRASCORE 014 3X40X150 (BALLOONS) ×2
BALLOON LUTONIX 018 4X40X130 (BALLOONS) IMPLANT
BALLOON LUTONIX DCB 5X80X130 (BALLOONS) IMPLANT
BALLOON LUTONIX DCB 6X100X130 (BALLOONS) IMPLANT
BALLOON LUTONIX DCB 6X60X130 (BALLOONS) IMPLANT
BALLOON ULTRSCRE 014 3X40X150 (BALLOONS) IMPLANT
CATH PIG 70CM (CATHETERS) ×1 IMPLANT
CATH VERT 5FR 125CM (CATHETERS) ×1 IMPLANT
COVER PROBE U/S 5X48 (MISCELLANEOUS) ×1 IMPLANT
DEVICE PRESTO INFLATION (MISCELLANEOUS) ×1 IMPLANT
DEVICE STARCLOSE SE CLOSURE (Vascular Products) ×1 IMPLANT
GLIDEWIRE ADV .035X260CM (WIRE) ×1 IMPLANT
NDL ENTRY 21GA 7CM ECHOTIP (NEEDLE) IMPLANT
NEEDLE ENTRY 21GA 7CM ECHOTIP (NEEDLE) ×2 IMPLANT
PACK ANGIOGRAPHY (CUSTOM PROCEDURE TRAY) ×2 IMPLANT
SET INTRO CAPELLA COAXIAL (SET/KITS/TRAYS/PACK) ×1 IMPLANT
SHEATH BRITE TIP 5FRX11 (SHEATH) ×1 IMPLANT
SHEATH RAABE 6FR (SHEATH) ×1 IMPLANT
STENT LIFESTENT 5F 7X40X135 (Permanent Stent) ×1 IMPLANT
STENT LIFESTENT 5F 7X60X135 (Permanent Stent) ×1 IMPLANT
TUBING CONTRAST HIGH PRESS 72 (TUBING) ×1 IMPLANT
WIRE J 3MM .035X145CM (WIRE) ×1 IMPLANT
WIRE RUNTHROUGH .014X300CM (WIRE) ×1 IMPLANT

## 2019-06-07 NOTE — H&P (Signed)
[facility] History & Physical Update  The patient was interviewed and re-examined.  The patient's previous History and Physical has been reviewed and is unchanged.  There is no change in the plan of care. We plan to proceed with the scheduled procedure.  Hortencia Pilar, MD  06/07/2019, 11:45 AM

## 2019-06-07 NOTE — Op Note (Signed)
[facility] Percutaneous Study/Intervention Procedural Note   Date of Surgery: 06/07/2019  Surgeon:  Katha Cabal, MD.  Pre-operative Diagnosis: Atherosclerotic occlusive disease bilateral lower extremities with lifestyle limiting claudication and mild rest pain right lower extremity  Post-operative diagnosis: Same  Procedure(s) Performed: 1. Introduction catheter into right lower extremity 3rd order catheter placement  2. Contrast injection right lower extremity for distal runoff   3. Percutaneous transluminal angioplasty and stent placement right superficial femoral artery and popliteal 4. Percutaneous transluminal angioplasty to 4 mm with a Lutonix drug-eluting balloon right anterior tibial artery             5.  Star close closure left common femoral arteriotomy  Anesthesia: Conscious sedation was administered under my direct supervision by the interventional radiology RN. IV Versed plus fentanyl were utilized. Continuous ECG, pulse oximetry and blood pressure was monitored throughout the entire procedure.  Conscious sedation was for a total of 60 minutes.  Sheath: 6 French Raby left common femoral retrograde  Contrast: 140 cc  Fluoroscopy Time: 11.0 minutes  Indications: Dolores Patty. presents with increasing pain of the right lower extremity.  Noninvasive studies as well as physical examination demonstrate significant atherosclerotic occlusive disease.  The patient is complaining of lifestyle limitations as well as mild rest pain symptoms.  The risks and benefits are reviewed all questions answered patient agrees to proceed.  Procedure: Syris Brookens. is a 63 y.o. y.o. male who was identified and appropriate procedural time out was performed. The patient was then placed supine on the table and prepped and draped in the usual sterile fashion.   Ultrasound was placed in the sterile sleeve  and the left groin was evaluated the left common femoral artery was echolucent and pulsatile indicating patency.  Image was recorded for the permanent record and under real-time visualization a microneedle was inserted into the common femoral artery microwire followed by a micro-sheath.  A J-wire was then advanced through the micro-sheath and a  5 [place] sheath was then inserted over a J-wire. J-wire was then advanced and a 5 French pigtail catheter was positioned at the level of T12. AP projection of the aorta was then obtained. Pigtail catheter was repositioned to above the bifurcation and a LAO view of the pelvis was obtained.  Subsequently a pigtail catheter with the stiff angle Glidewire was used to cross the aortic bifurcation the catheter wire were advanced down into the right distal external iliac artery. Oblique view of the femoral bifurcation was then obtained and subsequently the wire was reintroduced and the pigtail catheter negotiated into the SFA representing third order catheter placement. Distal runoff was then performed.  Diagnostic interpretation: The abdominal aorta is opacified the bolus injection contrast.  There is widely patent.  Very mild atherosclerotic changes are noted.  Bilateral common internal and external iliac arteries are widely patent with minimal atherosclerotic changes.  The right common femoral profunda femoris are widely patent with minimal atherosclerotic changes.  The distal SFA demonstrates a 60% stenosis at Hunter's canal.  There are increasing lesions noted from Hunter's canal through the mid popliteal with a focal greater than 80% stenosis directly behind the knee.  The distal popliteal is widely patent.  The trifurcation demonstrates extensive disease with a greater than 70% stenosis at the origin of the anterior tibial.  Beyond this lesion anterior tibial is of good caliber and widely patent there is some diffuse 20 to 30% areas of stenosis near the ankle but these  do not appear to be hemodynamically significant.  AT fills the dorsalis pedis and pedal arch.  The tibioperoneal trunk demonstrates a greater than 90% stenosis at its origin.  The peroneal is occluded throughout its entire course.  The posterior tibial occludes shortly after its origin it is reconstituted distally at the level of the ankle.  Based on these findings I elected to proceed with intervention.  5000 units of heparin was then given and allowed to circulate and a 6 [place] Raby sheath was advanced up and over the bifurcation and positioned in the femoral artery  KMP  catheter and stiff angle Glidewire were then negotiated down into the distal popliteal.  A 5 x 80 Lutonix balloon was used to angioplasty the popliteal arteries. Inflation were to 12 atmospheres for 1 minute. Follow-up imaging demonstrated greater than 50% stenosis and therefore a 6 mm x 100 mm an additional 6 mm x 40 mm Lutonix was also required more proximally.  Inflations were again to 12 atm for 1 minute.  Follow-up imaging demonstrated greater than 40% residual stenosis focally at the lesion behind the knee and therefore a 7 mm life stent was deployed and postdilated with a 6 mm Lutonix balloon.  Follow-up imaging demonstrated less than 15% residual stenosis.  The wire then negotiated into the anterior tibial.  A 3 mm x 40 mm ultra score balloon was then inflated to 10 atm for 1 minute.  Follow-up imaging demonstrates greater than 30% residual stenosis and a 4 mm x 40 Lutonix drug-eluting balloon was advanced across the lesion inflated to 6 atm for 1 minute.  Follow-up imaging demonstrated excellent result with less than 10% residual stenosis.  Distal runoff is preserved.  After review of these images the sheath is pulled into the left external iliac oblique of the common femoral is obtained and a Star close device deployed. There no immediate complications.   Findings:  The abdominal aorta is opacified the bolus injection  contrast.  There is widely patent.  Very mild atherosclerotic changes are noted.  Bilateral common internal and external iliac arteries are widely patent with minimal atherosclerotic changes.  The right common femoral profunda femoris are widely patent with minimal atherosclerotic changes.  The distal SFA demonstrates a 60% stenosis at Hunter's canal.  There are increasing lesions noted from Hunter's canal through the mid popliteal with a focal greater than 80% stenosis directly behind the knee.  The distal popliteal is widely patent.  The trifurcation demonstrates extensive disease with a greater than 70% stenosis at the origin of the anterior tibial.  Beyond this lesion anterior tibial is of good caliber and widely patent there is some diffuse 20 to 30% areas of stenosis near the ankle but these do not appear to be hemodynamically significant.  AT fills the dorsalis pedis and pedal arch.  The tibioperoneal trunk demonstrates a greater than 90% stenosis at its origin.  The peroneal is occluded throughout its entire course.  The posterior tibial occludes shortly after its origin it is reconstituted distally at the level of the ankle.  Following angioplasty anterior tibial now is in-line flow and looks quite nice with less than 10% residual stenosis. Angioplasty of the SFA at Hunter's canal through the mid popliteal demonstrates significant residual stenosis and therefore life stent is placed in the proximal to mid popliteal and postdilated to 6 mm with excellent result with less than 10% residual stenosis.    Summary: Successful recanalization right lower extremity for limb salvage  Disposition: Patient was taken to the recovery room in stable condition having tolerated the procedure well.  Ulmer Degen, Dolores Lory 06/07/2019,1:26 PM

## 2019-06-08 ENCOUNTER — Encounter: Payer: Self-pay | Admitting: Vascular Surgery

## 2019-06-22 ENCOUNTER — Other Ambulatory Visit (INDEPENDENT_AMBULATORY_CARE_PROVIDER_SITE_OTHER): Payer: Self-pay | Admitting: Vascular Surgery

## 2019-06-22 DIAGNOSIS — I70221 Atherosclerosis of native arteries of extremities with rest pain, right leg: Secondary | ICD-10-CM

## 2019-06-22 DIAGNOSIS — Z95820 Peripheral vascular angioplasty status with implants and grafts: Secondary | ICD-10-CM

## 2019-06-22 DIAGNOSIS — I739 Peripheral vascular disease, unspecified: Secondary | ICD-10-CM

## 2019-06-28 ENCOUNTER — Other Ambulatory Visit: Payer: Self-pay

## 2019-06-28 ENCOUNTER — Ambulatory Visit (INDEPENDENT_AMBULATORY_CARE_PROVIDER_SITE_OTHER): Payer: Medicare Other

## 2019-06-28 ENCOUNTER — Encounter (INDEPENDENT_AMBULATORY_CARE_PROVIDER_SITE_OTHER): Payer: Self-pay | Admitting: Nurse Practitioner

## 2019-06-28 ENCOUNTER — Ambulatory Visit (INDEPENDENT_AMBULATORY_CARE_PROVIDER_SITE_OTHER): Payer: Medicare Other | Admitting: Nurse Practitioner

## 2019-06-28 VITALS — BP 126/87 | HR 90 | Resp 16 | Wt 140.0 lb

## 2019-06-28 DIAGNOSIS — I739 Peripheral vascular disease, unspecified: Secondary | ICD-10-CM

## 2019-06-28 DIAGNOSIS — Z95820 Peripheral vascular angioplasty status with implants and grafts: Secondary | ICD-10-CM

## 2019-06-28 DIAGNOSIS — E119 Type 2 diabetes mellitus without complications: Secondary | ICD-10-CM | POA: Diagnosis not present

## 2019-06-28 DIAGNOSIS — I10 Essential (primary) hypertension: Secondary | ICD-10-CM | POA: Diagnosis not present

## 2019-06-28 DIAGNOSIS — I70221 Atherosclerosis of native arteries of extremities with rest pain, right leg: Secondary | ICD-10-CM

## 2019-07-03 ENCOUNTER — Encounter (INDEPENDENT_AMBULATORY_CARE_PROVIDER_SITE_OTHER): Payer: Self-pay | Admitting: Nurse Practitioner

## 2019-07-03 NOTE — Progress Notes (Signed)
SUBJECTIVE:  Patient ID: Evan Simplerorman Mill Jr., male    DOB: 22-May-1956, 63 y.o.   MRN: [id number] Chief Complaint  Patient presents with  . Follow-up    [facility] 3week with ultrasound    HPI  Evan Simplerorman Kneebone Jr. is a 63 y.o. male The patient returns to the office for followup and review status post angiogram with intervention. The patient notes improvement in the lower extremity symptoms. No interval shortening of the patient's claudication distance or rest pain symptoms.  The patient does however complain about burning and stinging on the bottom of his feet.  However this was also consistent prior to his procedure.  Previous wounds have now healed.  No new ulcers or wounds have occurred since the last visit.  There have been no significant changes to the patient's overall health care.  The patient denies amaurosis fugax or recent TIA symptoms. There are no recent neurological changes noted. The patient denies history of DVT, PE or superficial thrombophlebitis. The patient denies recent episodes of angina or shortness of breath.   ABI's Rt=1.19 and Lt=0.95 (previous ABI's Rt=1.15 and Lt=0.83) Duplex US of the right lower extremity arterial system shows biphasic waveforms throughout the level of the right lower extremity.  The distal peroneal artery is occluded.  The left lower extremity has monophasic/triphasic waveforms in the tibial arteries.  The patient has strong toe waveforms bilaterally.  Past Medical History:  Diagnosis Date  . Diabetes mellitus without complication (HCC)   . Hypertension   . Peripheral vascular disease [facility](HCC)     Past Surgical History:  Procedure Laterality Date  . COLONOSCOPY    . ESOPHAGOGASTRODUODENOSCOPY (EGD) WITH PROPOFOL N/A 06/01/2016   Procedure: ESOPHAGOGASTRODUODENOSCOPY (EGD) WITH PROPOFOL;  Surgeon: Scot Junobert T Elliott, MD;  [facility];  Service: Endoscopy;  Laterality: N/A;  . LOWER EXTREMITY ANGIOGRAPHY Right 06/07/2019   Procedure: LOWER  EXTREMITY ANGIOGRAPHY;  Surgeon: Renford DillsSchnier, Gregory G, MD;  Location: [facility] INVASIVE CV LAB;  Service: Cardiovascular;  Laterality: Right;    Social History   Socioeconomic History  . Marital status: Single    Spouse name: Not on file  . Number of children: 0  . Years of education: Not on file  . Highest education level: Not on file  Occupational History  . Not on file  Tobacco Use  . Smoking status: Current Every Day Smoker    Packs/day: 0.10    Years: 50.00    Pack years: 5.00    Types: Cigarettes  . Smokeless tobacco: Never Used  . Tobacco comment: 1 cigarette /day  Substance and Sexual Activity  . Alcohol use: Yes    Comment: last drink liquor on thursday  . Drug use: No  . Sexual activity: Not on file  Other Topics Concern  . Not on file  Social History Narrative  . Not on file   Social Determinants of Health   Financial Resource Strain: Low Risk   . Difficulty of Paying Living Expenses: Not very hard  Food Insecurity:   . Worried About Programme researcher, broadcasting/film/videounning Out of Food in the Last Year: Not on file  . Ran Out of Food in the Last Year: Not on file  Transportation Needs: No Transportation Needs  . Lack of Transportation (Medical): No  . Lack of Transportation (Non-Medical): No  Physical Activity:   . Days of Exercise per Week: Not on file  . Minutes of Exercise per Session: Not on file  Stress: No Stress Concern Present  . Feeling of Stress :  Only a little  Social Connections: Unknown  . Frequency of Communication with Friends and Family: More than three times a week  . Frequency of Social Gatherings with Friends and Family: Not on file  . Attends Religious Services: Not on file  . Active Member of Clubs or Organizations: Not on file  . Attends Banker Meetings: Not on file  . Marital Status: Not on file  Intimate Partner Violence: Not At Risk  . Fear of Current or Ex-Partner: No  . Emotionally Abused: No  . Physically Abused: No  . Sexually Abused: No     Family History  Problem Relation Age of Onset  . Diabetes Sister     No Known Allergies   Review of Systems   Review of Systems: Negative Unless Checked Constitutional: [] Weight loss  [] Fever  [] Chills Cardiac: [] Chest pain   []  Atrial Fibrillation  [] Palpitations   [] Shortness of breath when laying flat   [] Shortness of breath with exertion. [] Shortness of breath at rest Vascular:  [] Pain in legs with walking   [] Pain in legs with standing [] Pain in legs when laying flat   [x] Claudication    [] Pain in feet when laying flat    [] History of DVT   [] Phlebitis   [] Swelling in legs   [] Varicose veins   [] Non-healing ulcers Pulmonary:   [] Uses home oxygen   [] Productive cough   [] Hemoptysis   [] Wheeze  [] COPD   [] Asthma Neurologic:  [] Dizziness   [] Seizures  [] Blackouts [] History of stroke   [] History of TIA  [] Aphasia   [] Temporary Blindness   [] Weakness or numbness in arm   [x] Weakness or numbness in leg Musculoskeletal:   [] Joint swelling   [] Joint pain   [] Low back pain  []  History of Knee Replacement [] Arthritis [] back Surgeries  []  Spinal Stenosis    Hematologic:  [] Easy bruising  [] Easy bleeding   [] Hypercoagulable state   [] Anemic Gastrointestinal:  [] Diarrhea   [] Vomiting  [] Gastroesophageal reflux/heartburn   [] Difficulty swallowing. [] Abdominal pain Genitourinary:  [] Chronic kidney disease   [] Difficult urination  [] Anuric   [] Blood in urine [] Frequent urination  [] Burning with urination   [] Hematuria Skin:  [] Rashes   [] Ulcers [] Wounds Psychological:  [] History of anxiety   []  History of major depression  []  Memory Difficulties      OBJECTIVE:   Physical Exam  BP 126/87 (BP Location: Right Arm)   Pulse 90   Resp 16   Wt 140 lb (63.5 kg)   BMI 23.30 kg/m   Gen: WD/WN, NAD Head: [place]/AT, No temporalis wasting.  Ear/Nose/Throat: Hearing grossly intact, nares w/o erythema or drainage Eyes: PER, EOMI, sclera nonicteric.  Neck: Supple, no masses.  No JVD.  Pulmonary:   Good air movement, no use of accessory muscles.  Cardiac: RRR Vascular:  Vessel Right Left  Radial Palpable Palpable  Dorsalis Pedis Palpable Palpable  Posterior Tibial Palpable Palpable   Gastrointestinal: soft, non-distended. No guarding/no peritoneal signs.  Musculoskeletal: M/S 5/5 throughout.  No deformity or atrophy.  Neurologic: Pain and light touch intact in extremities.  Symmetrical.  Speech is fluent. Motor exam as listed above. Psychiatric: Judgment intact, Mood & affect appropriate for pt's clinical situation. Dermatologic: No Venous rashes. No Ulcers Noted.  No changes consistent with cellulitis. Lymph : No Cervical lymphadenopathy, no lichenification or skin changes of chronic lymphedema.       ASSESSMENT AND PLAN:  1. PAD (peripheral artery disease) (HCC)  Recommend:  The patient has evidence of atherosclerosis of the lower  extremities with claudication.  The patient does not voice lifestyle limiting changes at this point in time.  Noninvasive studies do not suggest clinically significant change.  No invasive studies, angiography or surgery at this time The patient should continue walking and begin a more formal exercise program.  The patient should continue antiplatelet therapy and aggressive treatment of the lipid abnormalities  No changes in the patient's medications at this time  The patient should continue wearing graduated compression socks 10-15 mmHg strength to control the mild edema.   - VAS [place] ABI WITH/WO TBI; Future  2. Essential hypertension Continue antihypertensive medications as already ordered, these medications have been reviewed and there are no changes at this time.   3. Type 2 diabetes mellitus without complication, without long-term current use of insulin ([place]) Depending on how well controlled the patient's diabetes is this can definitely attribute to the neuropathic feelings of the patient having.  Should continue with current  medication.   Current Outpatient Medications on File Prior to Visit  Medication Sig Dispense Refill  . albuterol (VENTOLIN HFA) 108 (90 Base) MCG/ACT inhaler Inhale into the lungs every 6 (six) hours as needed for wheezing or shortness of breath.    Marland Kitchen aspirin EC 81 MG tablet Take 81 mg by mouth daily.    Marland Kitchen atorvastatin (LIPITOR) 40 MG tablet Take 40 mg by mouth daily.    . clopidogrel (PLAVIX) 75 MG tablet Take 1 tablet (75 mg total) by mouth daily. 30 tablet 4  . gabapentin (NEURONTIN) 300 MG capsule Take 1 capsule (300 mg total) by mouth 3 (three) times daily. 90 capsule 3  . lisinopril (PRINIVIL,ZESTRIL) 10 MG tablet Take 1 tablet (10 mg total) by mouth daily. 30 tablet 0  . metformin (FORTAMET) 1000 MG (OSM) 24 hr tablet Take 1,000 mg by mouth daily with breakfast.    . nicotine (NICODERM CQ - DOSED IN MG/24 HOURS) 21 mg/24hr patch Place 1 patch (21 mg total) onto the skin daily. 28 patch 0  . pantoprazole (PROTONIX) 40 MG tablet Take 1 tablet (40 mg total) by mouth 2 (two) times daily. 60 tablet 0  . sitaGLIPtin-metformin (JANUMET) 50-1000 MG tablet Take 1 tablet by mouth 2 (two) times daily with a meal.     No current facility-administered medications on file prior to visit.    There are no Patient Instructions on file for this visit. No follow-ups on file.   Kris Hartmann, NP  This note was completed with Sales executive.  Any errors are purely unintentional.

## 2019-09-25 ENCOUNTER — Encounter (INDEPENDENT_AMBULATORY_CARE_PROVIDER_SITE_OTHER): Payer: Self-pay | Admitting: Vascular Surgery

## 2019-09-25 ENCOUNTER — Other Ambulatory Visit: Payer: Self-pay

## 2019-09-25 ENCOUNTER — Ambulatory Visit (INDEPENDENT_AMBULATORY_CARE_PROVIDER_SITE_OTHER): Payer: Medicare Other | Admitting: Vascular Surgery

## 2019-09-25 ENCOUNTER — Ambulatory Visit (INDEPENDENT_AMBULATORY_CARE_PROVIDER_SITE_OTHER): Payer: Medicare Other

## 2019-09-25 VITALS — BP 137/80 | HR 80 | Resp 16 | Wt 139.8 lb

## 2019-09-25 DIAGNOSIS — I10 Essential (primary) hypertension: Secondary | ICD-10-CM

## 2019-09-25 DIAGNOSIS — E782 Mixed hyperlipidemia: Secondary | ICD-10-CM

## 2019-09-25 DIAGNOSIS — R0989 Other specified symptoms and signs involving the circulatory and respiratory systems: Secondary | ICD-10-CM | POA: Diagnosis not present

## 2019-09-25 DIAGNOSIS — I739 Peripheral vascular disease, unspecified: Secondary | ICD-10-CM

## 2019-09-25 NOTE — Progress Notes (Signed)
MRN : [id number]  Evan Rose. is a 64 y.o. (09/08/1955) male who presents with chief complaint of  Chief Complaint  Patient presents with  . Follow-up    58month ultrasound follow up  .  History of Present Illness:   The patient returns to the office for followup and review status post angiogram with intervention. The patient notes improvement in the lower extremity symptoms. No interval shortening of the patient's claudication distance or rest pain symptoms. Previous wounds have now healed.  No new ulcers or wounds have occurred since the last visit.  There have been no significant changes to the patient's overall health care.  The patient denies amaurosis fugax or recent TIA symptoms. There are no recent neurological changes noted. The patient denies history of DVT, PE or superficial thrombophlebitis. The patient denies recent episodes of angina or shortness of breath.   ABI's Rt=1.26 and Lt=0.96  (previous ABI's Rt=1.19 and Lt=0.95)   Current Meds  Medication Sig  . albuterol (VENTOLIN HFA) 108 (90 Base) MCG/ACT inhaler Inhale into the lungs every 6 (six) hours as needed for wheezing or shortness of breath.  Marland Kitchen aspirin EC 81 MG tablet Take 81 mg by mouth daily.  Marland Kitchen atorvastatin (LIPITOR) 40 MG tablet Take 40 mg by mouth daily.  . clopidogrel (PLAVIX) 75 MG tablet Take 1 tablet (75 mg total) by mouth daily.  Marland Kitchen gabapentin (NEURONTIN) 300 MG capsule Take 1 capsule (300 mg total) by mouth 3 (three) times daily.  Marland Kitchen lisinopril (PRINIVIL,ZESTRIL) 10 MG tablet Take 1 tablet (10 mg total) by mouth daily.  . metformin (FORTAMET) 1000 MG (OSM) 24 hr tablet Take 1,000 mg by mouth daily with breakfast.  . nicotine (NICODERM CQ - DOSED IN MG/24 HOURS) 21 mg/24hr patch Place 1 patch (21 mg total) onto the skin daily.  . pantoprazole (PROTONIX) 40 MG tablet Take 1 tablet (40 mg total) by mouth 2 (two) times daily.  . sitaGLIPtin-metformin (JANUMET) 50-1000 MG tablet Take 1 tablet by mouth  2 (two) times daily with a meal.    Past Medical History:  Diagnosis Date  . Diabetes mellitus without complication (HCC)   . Hypertension   . Peripheral vascular disease [facility])     Past Surgical History:  Procedure Laterality Date  . COLONOSCOPY    . ESOPHAGOGASTRODUODENOSCOPY (EGD) WITH PROPOFOL N/A 06/01/2016   Procedure: ESOPHAGOGASTRODUODENOSCOPY (EGD) WITH PROPOFOL;  Surgeon: Scot Jun, MD;  Location: [facility] ENDOSCOPY;  Service: Endoscopy;  Laterality: N/A;  . LOWER EXTREMITY ANGIOGRAPHY Right 06/07/2019   Procedure: LOWER EXTREMITY ANGIOGRAPHY;  Surgeon: Renford Dills, MD;  Location: [facility] INVASIVE CV LAB;  Service: Cardiovascular;  Laterality: Right;    Social History Social History   Tobacco Use  . Smoking status: Current Every Day Smoker    Packs/day: 0.10    Years: 50.00    Pack years: 5.00    Types: Cigarettes  . Smokeless tobacco: Never Used  . Tobacco comment: 1 cigarette /day  Substance Use Topics  . Alcohol use: Yes    Comment: last drink liquor on thursday  . Drug use: No    Family History Family History  Problem Relation Age of Onset  . Diabetes Sister     No Known Allergies   REVIEW OF SYSTEMS (Negative unless checked)  Constitutional: [] Weight loss  [] Fever  [] Chills Cardiac: [] Chest pain   [] Chest pressure   [] Palpitations   [] Shortness of breath when laying flat   [] Shortness of breath with exertion. Vascular:  [  x]Pain in legs with walking   [x] Pain in legs at rest  [] History of DVT   [] Phlebitis   [] Swelling in legs   [] Varicose veins   [] Non-healing ulcers Pulmonary:   [] Uses home oxygen   [] Productive cough   [] Hemoptysis   [] Wheeze  [] COPD   [] Asthma Neurologic:  [] Dizziness   [] Seizures   [] History of stroke   [] History of TIA  [] Aphasia   [] Vissual changes   [] Weakness or numbness in arm   [x] Weakness or numbness in leg Musculoskeletal:   [] Joint swelling   [] Joint pain   [] Low back pain Hematologic:  [] Easy bruising  [] Easy  bleeding   [] Hypercoagulable state   [] Anemic Gastrointestinal:  [] Diarrhea   [] Vomiting  [] Gastroesophageal reflux/heartburn   [] Difficulty swallowing. Genitourinary:  [] Chronic kidney disease   [] Difficult urination  [] Frequent urination   [] Blood in urine Skin:  [] Rashes   [] Ulcers  Psychological:  [] History of anxiety   []  History of major depression.  Physical Examination  Vitals:   09/25/19 1454  BP: 137/80  Pulse: 80  Resp: 16  Weight: 139 lb 12.8 oz (63.4 kg)   Body mass index is 23.26 kg/m. Gen: WD/WN, NAD Head: [place]/AT, No temporalis wasting.  Ear/Nose/Throat: Hearing grossly intact, nares w/o erythema or drainage Eyes: PER, EOMI, sclera nonicteric.  Neck: Supple, no large masses.   Pulmonary:  Good air movement, no audible wheezing bilaterally, no use of accessory muscles.  Cardiac: RRR, no JVD Vascular:  Vessel Right Left  Radial Palpable Palpable  Gastrointestinal: Non-distended. No guarding/no peritoneal signs.  Musculoskeletal: M/S 5/5 throughout.  No deformity or atrophy.  Neurologic: CN 2-12 intact. Symmetrical.  Speech is fluent. Motor exam as listed above. Psychiatric: Judgment intact, Mood & affect appropriate for pt's clinical situation. Dermatologic: No rashes or ulcers noted.  No changes consistent with cellulitis.  CBC Lab Results  Component Value Date   WBC 7.4 06/01/2016   HGB 9.4 (L) 06/02/2016   HCT 25.3 (L) 06/01/2016   MCV 98.5 06/01/2016   PLT 125 (L) 06/01/2016    BMET    Component Value Date/Time   NA 138 06/02/2016 0446   K 3.7 06/02/2016 0446   CL 105 06/02/2016 0446   CO2 28 06/02/2016 0446   GLUCOSE 114 (H) 06/02/2016 0446   BUN 15 06/07/2019 1027   CREATININE 0.99 06/07/2019 1027   CALCIUM 8.7 (L) 06/02/2016 0446   GFRNONAA >60 06/07/2019 1027   GFRAA >60 06/07/2019 1027   CrCl cannot be calculated (Patient's most recent lab result is older than the maximum 21 days allowed.).  COAG Lab Results  Component Value Date    INR 0.99 05/30/2016    Radiology No results found.   Assessment/Plan 1. PAD (peripheral artery disease) (HCC) Recommend:  The patient is status post successful angiogram with intervention.  The patient reports that the claudication symptoms and leg pain is essentially gone.   The patient denies lifestyle limiting changes at this point in time.  No further invasive studies, angiography or surgery at this time The patient should continue walking and begin a more formal exercise program.  The patient should continue antiplatelet therapy and aggressive treatment of the lipid abnormalities  Smoking cessation was again discussed  The patient should continue wearing graduated compression socks 10-15 mmHg strength to control the mild edema.  Patient should undergo noninvasive studies as ordered. The patient will follow up with me after the studies.   - VAS ABI WITH/WO TBI; Future  2. Left  carotid bruit Recommend:  Given the patient's asymptomatic subcritical stenosis no further invasive testing or surgery at this time.  Continue antiplatelet therapy as prescribed Continue management of CAD, HTN and Hyperlipidemia Healthy heart diet,  encouraged exercise at least 4 times per week  Follow up in 6 months with duplex ultrasound and physical exam   - VAS US CAROTID; Future  3. Essential hypertension Continue antihypertensive medications as already ordered, these medications have been reviewed and there are no changes at this time.   4. Mixed hyperlipidemia Continue statin as ordered and reviewed, no changes at this time     Hortencia Pilar, MD  09/25/2019 3:00 PM

## 2020-04-04 ENCOUNTER — Ambulatory Visit (INDEPENDENT_AMBULATORY_CARE_PROVIDER_SITE_OTHER): Payer: Medicare Other | Admitting: Vascular Surgery

## 2020-04-04 ENCOUNTER — Ambulatory Visit (INDEPENDENT_AMBULATORY_CARE_PROVIDER_SITE_OTHER): Payer: Medicare Other

## 2020-04-04 ENCOUNTER — Encounter (INDEPENDENT_AMBULATORY_CARE_PROVIDER_SITE_OTHER): Payer: Self-pay | Admitting: Vascular Surgery

## 2020-04-04 ENCOUNTER — Other Ambulatory Visit: Payer: Self-pay

## 2020-04-04 VITALS — BP 153/97 | HR 70 | Resp 16 | Wt 143.0 lb

## 2020-04-04 DIAGNOSIS — E119 Type 2 diabetes mellitus without complications: Secondary | ICD-10-CM

## 2020-04-04 DIAGNOSIS — I739 Peripheral vascular disease, unspecified: Secondary | ICD-10-CM | POA: Diagnosis not present

## 2020-04-04 DIAGNOSIS — R0989 Other specified symptoms and signs involving the circulatory and respiratory systems: Secondary | ICD-10-CM | POA: Diagnosis not present

## 2020-04-04 DIAGNOSIS — I10 Essential (primary) hypertension: Secondary | ICD-10-CM

## 2020-04-04 DIAGNOSIS — I6523 Occlusion and stenosis of bilateral carotid arteries: Secondary | ICD-10-CM

## 2020-04-04 DIAGNOSIS — E782 Mixed hyperlipidemia: Secondary | ICD-10-CM

## 2020-04-04 DIAGNOSIS — I6529 Occlusion and stenosis of unspecified carotid artery: Secondary | ICD-10-CM | POA: Insufficient documentation

## 2020-04-04 NOTE — Progress Notes (Signed)
MRN : [id number]  Evan Rose. is a 64 y.o. ([birth year]) male who presents with chief complaint of No chief complaint on file. Marland Kitchen  History of Present Illness:   The patient returns to the office for followup and review status post angiogram with intervention. The patient notes improvement in the lower extremity symptoms. No interval shortening of the patient's claudication distance or rest pain symptoms. Previous wounds have now healed.  No new ulcers or wounds have occurred since the last visit.  There have been no significant changes to the patient's overall health care.  The patient denies amaurosis fugax or recent TIA symptoms. There are no recent neurological changes noted. The patient denies history of DVT, PE or superficial thrombophlebitis. The patient denies recent episodes of angina or shortness of breath.   ABI's Rt=1.16 and Lt=0.95  (previous ABI's Rt=1.26 and Lt=0.96) Carotid duplex shows bilateral <40% stenosis  No outpatient medications have been marked as taking for the 04/04/20 encounter (Appointment) with Gilda Crease, Latina Craver, MD.    Past Medical History:  Diagnosis Date  . Diabetes mellitus without complication (HCC)   . Hypertension   . Peripheral vascular disease [facility])     Past Surgical History:  Procedure Laterality Date  . COLONOSCOPY    . ESOPHAGOGASTRODUODENOSCOPY (EGD) WITH PROPOFOL N/A 06/01/2016   Procedure: ESOPHAGOGASTRODUODENOSCOPY (EGD) WITH PROPOFOL;  Surgeon: Scot Jun, MD;  Location: [facility] ENDOSCOPY;  Service: Endoscopy;  Laterality: N/A;  . LOWER EXTREMITY ANGIOGRAPHY Right 06/07/2019   Procedure: LOWER EXTREMITY ANGIOGRAPHY;  Surgeon: Renford Dills, MD;  Location: [facility] INVASIVE CV LAB;  Service: Cardiovascular;  Laterality: Right;    Social History Social History   Tobacco Use  . Smoking status: Current Every Day Smoker    Packs/day: 0.10    Years: 50.00    Pack years: 5.00    Types: Cigarettes  . Smokeless tobacco:  Never Used  . Tobacco comment: 1 cigarette /day  Substance Use Topics  . Alcohol use: Yes    Comment: last drink liquor on thursday  . Drug use: No    Family History Family History  Problem Relation Age of Onset  . Diabetes Sister     No Known Allergies   REVIEW OF SYSTEMS (Negative unless checked)  Constitutional: [] Weight loss  [] Fever  [] Chills Cardiac: [] Chest pain   [] Chest pressure   [] Palpitations   [] Shortness of breath when laying flat   [] Shortness of breath with exertion. Vascular:  [x] Pain in legs with walking   [] Pain in legs at rest  [] History of DVT   [] Phlebitis   [] Swelling in legs   [] Varicose veins   [] Non-healing ulcers Pulmonary:   [] Uses home oxygen   [] Productive cough   [] Hemoptysis   [] Wheeze  [] COPD   [] Asthma Neurologic:  [] Dizziness   [] Seizures   [] History of stroke   [] History of TIA  [] Aphasia   [] Vissual changes   [] Weakness or numbness in arm   [] Weakness or numbness in leg Musculoskeletal:   [] Joint swelling   [] Joint pain   [] Low back pain Hematologic:  [] Easy bruising  [] Easy bleeding   [] Hypercoagulable state   [] Anemic Gastrointestinal:  [] Diarrhea   [] Vomiting  [x] Gastroesophageal reflux/heartburn   [] Difficulty swallowing. Genitourinary:  [] Chronic kidney disease   [] Difficult urination  [] Frequent urination   [] Blood in urine Skin:  [] Rashes   [] Ulcers  Psychological:  [] History of anxiety   []  History of major depression.  Physical Examination  There were no vitals filed for  this visit. There is no height or weight on file to calculate BMI. Gen: WD/WN, NAD Head: [place]/AT, No temporalis wasting.  Ear/Nose/Throat: Hearing grossly intact, nares w/o erythema or drainage Eyes: PER, EOMI, sclera nonicteric.  Neck: Supple, no large masses.   Pulmonary:  Good air movement, no audible wheezing bilaterally, no use of accessory muscles.  Cardiac: RRR, no JVD Vascular: bilateral carotid bruits Vessel Right Left  Radial Palpable Palpable  Carotid  Palpable Palpable  PT Not Palpable Not Palpable  DP Not Palpable Not Palpable  Gastrointestinal: Non-distended. No guarding/no peritoneal signs.  Musculoskeletal: M/S 5/5 throughout.  No deformity or atrophy.  Neurologic: CN 2-12 intact. Symmetrical.  Speech is fluent. Motor exam as listed above. Psychiatric: Judgment intact, Mood & affect appropriate for pt's clinical situation. Dermatologic: No rashes or ulcers noted.  No changes consistent with cellulitis.   CBC Lab Results  Component Value Date   WBC 7.4 06/01/2016   HGB 9.4 (L) 06/02/2016   HCT 25.3 (L) 06/01/2016   MCV 98.5 06/01/2016   PLT 125 (L) 06/01/2016    BMET    Component Value Date/Time   NA 138 06/02/2016 0446   K 3.7 06/02/2016 0446   CL 105 06/02/2016 0446   CO2 28 06/02/2016 0446   GLUCOSE 114 (H) 06/02/2016 0446   BUN 15 06/07/2019 1027   CREATININE 0.99 06/07/2019 1027   CALCIUM 8.7 (L) 06/02/2016 0446   GFRNONAA >60 06/07/2019 1027   GFRAA >60 06/07/2019 1027   CrCl cannot be calculated (Patient's most recent lab result is older than the maximum 21 days allowed.).  COAG Lab Results  Component Value Date   INR 0.99 05/30/2016    Radiology No results found.   Assessment/Plan 1. Bilateral carotid artery stenosis Recommend:  Given the patient's asymptomatic subcritical stenosis no further invasive testing or surgery at this time.  Duplex ultrasound shows <40% stenosis bilaterally.  Continue antiplatelet therapy as prescribed Continue management of CAD, HTN and Hyperlipidemia Healthy heart diet,  encouraged exercise at least 4 times per week Follow up in 12 months with duplex ultrasound and physical exam  - VAS US CAROTID; Future  2. PAD (peripheral artery disease) (HCC)  Recommend:  The patient has evidence of atherosclerosis of the lower extremities with claudication.  The patient does not voice lifestyle limiting changes at this point in time.  Noninvasive studies do not suggest  clinically significant change.  No invasive studies, angiography or surgery at this time The patient should continue walking and begin a more formal exercise program.  The patient should continue antiplatelet therapy and aggressive treatment of the lipid abnormalities  No changes in the patient's medications at this time  The patient should continue wearing graduated compression socks 10-15 mmHg strength to control the mild edema.   - VAS [place] ABI WITH/WO TBI; Future  3. Essential hypertension Continue antihypertensive medications as already ordered, these medications have been reviewed and there are no changes at this time.   4. Type 2 diabetes mellitus without complication, without long-term current use of insulin (HCC) Continue hypoglycemic medications as already ordered, these medications have been reviewed and there are no changes at this time.  Hgb A1C to be monitored as already arranged by primary service   5. Mixed hyperlipidemia Continue statin as ordered and reviewed, no changes at this time    Levora Dredge, MD  04/04/2020 1:09 PM

## 2020-04-05 ENCOUNTER — Encounter (INDEPENDENT_AMBULATORY_CARE_PROVIDER_SITE_OTHER): Payer: Self-pay | Admitting: Vascular Surgery

## 2020-05-06 ENCOUNTER — Other Ambulatory Visit: Payer: Self-pay | Admitting: Podiatry

## 2020-05-06 NOTE — Telephone Encounter (Signed)
Please advise 

## 2020-05-09 ENCOUNTER — Telehealth: Payer: Self-pay

## 2020-05-09 DIAGNOSIS — Z87891 Personal history of nicotine dependence: Secondary | ICD-10-CM

## 2020-05-09 DIAGNOSIS — Z122 Encounter for screening for malignant neoplasm of respiratory organs: Secondary | ICD-10-CM

## 2020-05-09 NOTE — Telephone Encounter (Signed)
Called patient's number, but he was not available. Spoke to [place], [place], and left message to have pt call to schedule Lung screening CT.

## 2020-05-13 NOTE — Telephone Encounter (Signed)
Contacted and scheduled. Current smoker, 101 pack year history

## 2020-05-13 NOTE — Addendum Note (Signed)
Addended by: Jonne Ply on: 05/13/2020 02:09 PM   Modules accepted: Orders

## 2020-05-16 ENCOUNTER — Ambulatory Visit
Admission: RE | Admit: 2020-05-16 | Discharge: 2020-05-16 | Disposition: A | Discharge status: Home / Self Care | Payer: Medicare Other | Source: Ambulatory Visit | Attending: Nurse Practitioner | Admitting: Nurse Practitioner

## 2020-05-16 ENCOUNTER — Other Ambulatory Visit: Payer: Self-pay

## 2020-05-16 DIAGNOSIS — Z87891 Personal history of nicotine dependence: Secondary | ICD-10-CM | POA: Diagnosis not present

## 2020-05-16 DIAGNOSIS — Z122 Encounter for screening for malignant neoplasm of respiratory organs: Secondary | ICD-10-CM | POA: Diagnosis present

## 2020-05-18 ENCOUNTER — Encounter: Payer: Self-pay | Admitting: *Deleted

## 2020-12-11 IMAGING — CT CT CHEST LUNG CANCER SCREENING LOW DOSE W/O CM
2 of 5 series · 15 of 40 positions shown, 18 images · non-contrast
Comparison: None.

CLINICAL DATA: 63-year-old male current smoker, with 100 pack-year
history of smoking, for initial lung cancer screening

EXAM:
CT CHEST WITHOUT CONTRAST LOW-DOSE FOR LUNG CANCER SCREENING
TECHNIQUE: Multidetector CT imaging of the chest was performed following the
standard protocol without IV contrast.

[Series 3: lung 1.00 · axial · 0.62mm/px · z∈[-1259,-921]mm · 12 of 374 slices shown, 15 images]
[im 18/374  mediastinal]
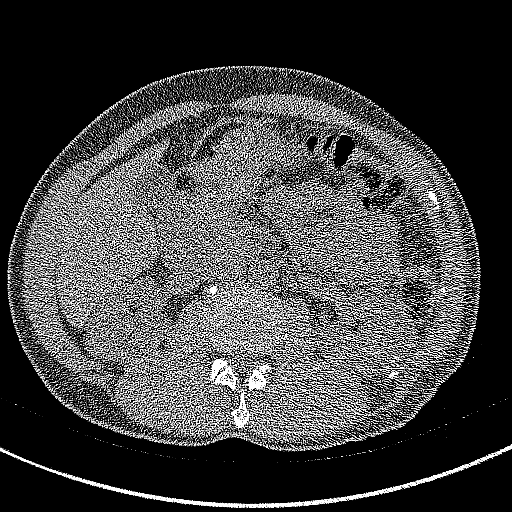
[im 18/374  lung]
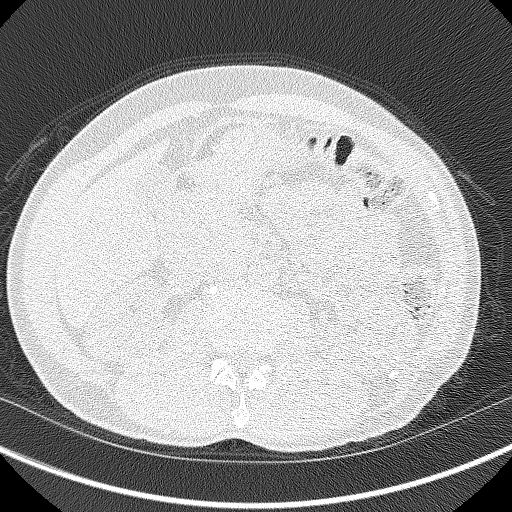
[im 54/374  lung]
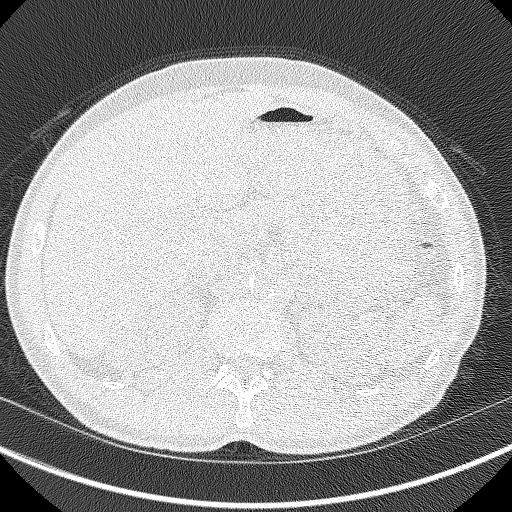
[im 89/374  lung]
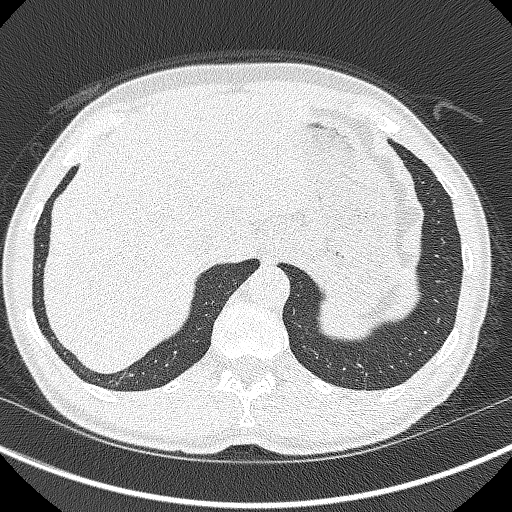
[im 107/374  lung]
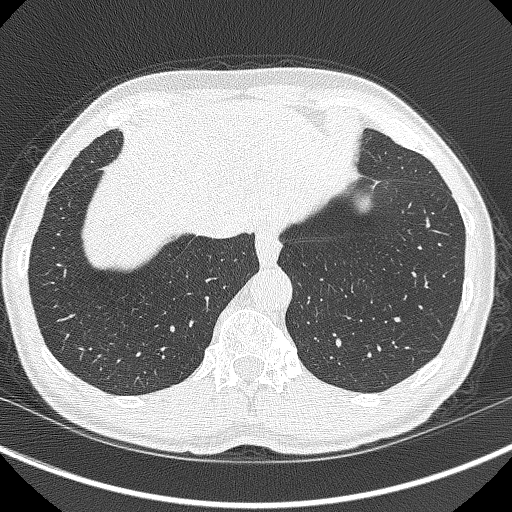
[im 143/374  mediastinal]
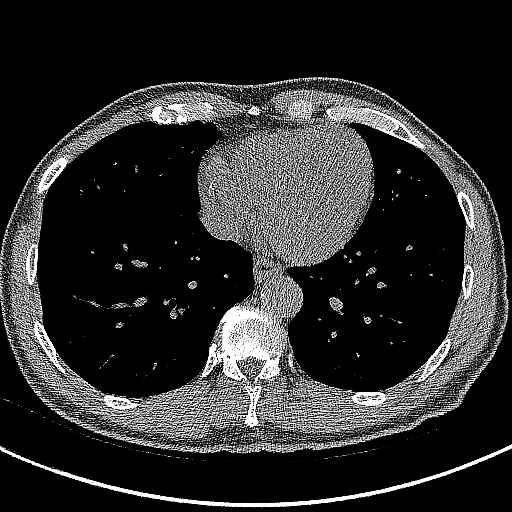
[im 143/374  lung]
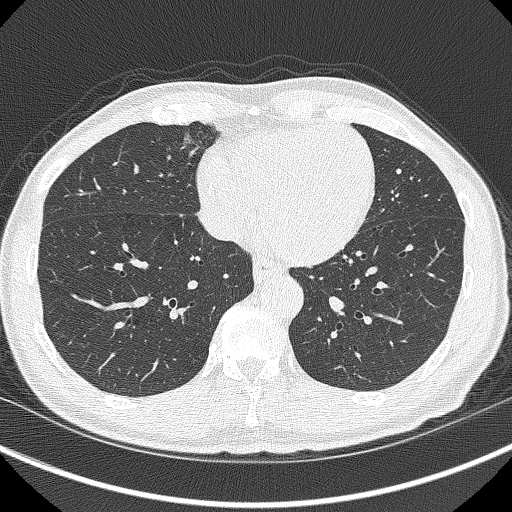
[im 178/374  lung]
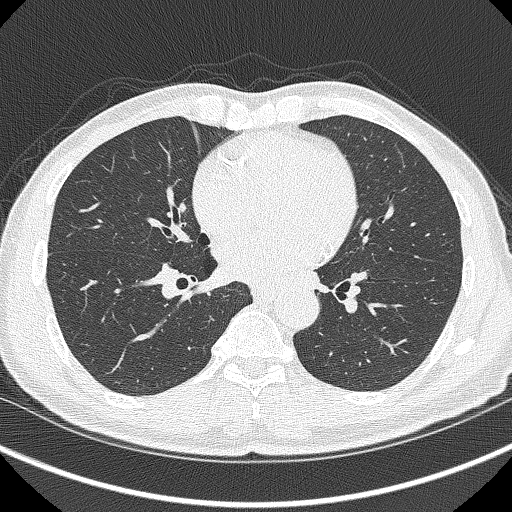
[im 196/374  lung]
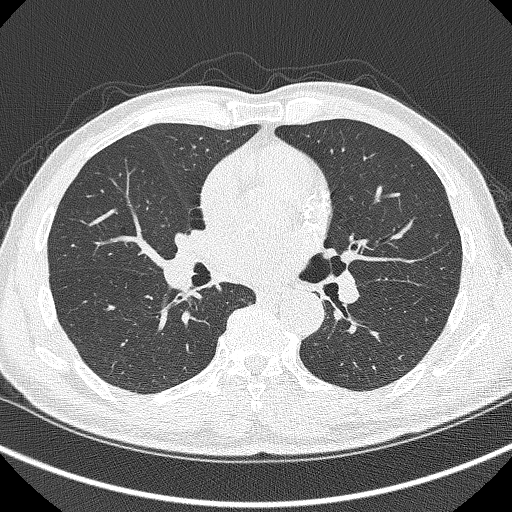
[im 231/374  lung]
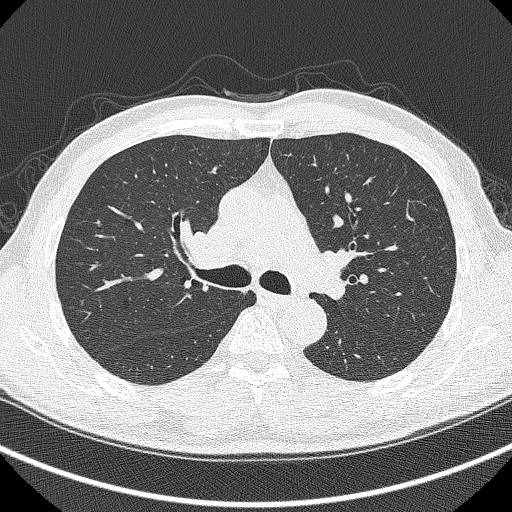
[im 267/374  mediastinal]
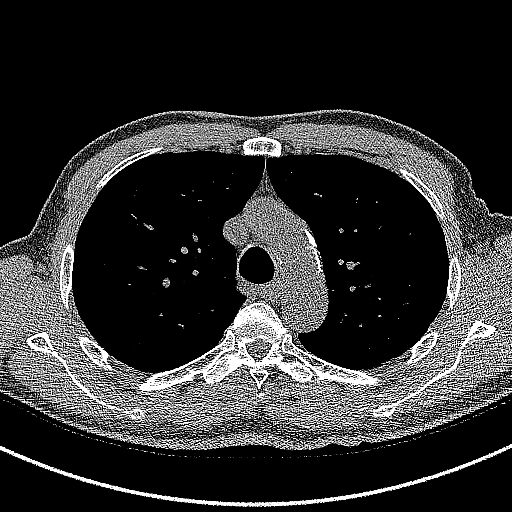
[im 267/374  lung]
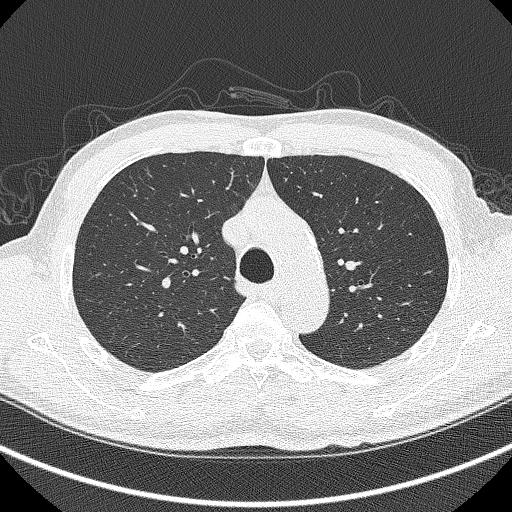
[im 285/374  lung]
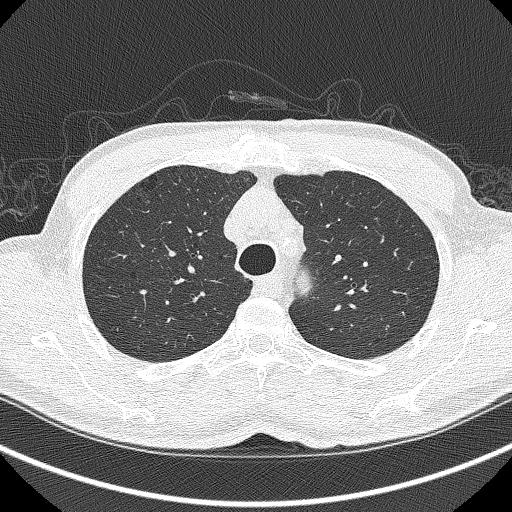
[im 320/374  lung]
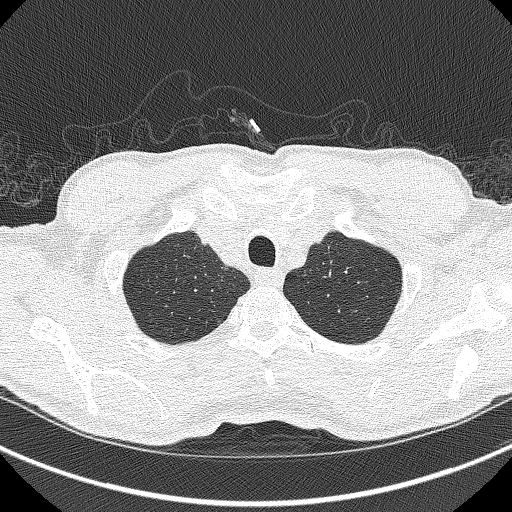
[im 356/374  lung]
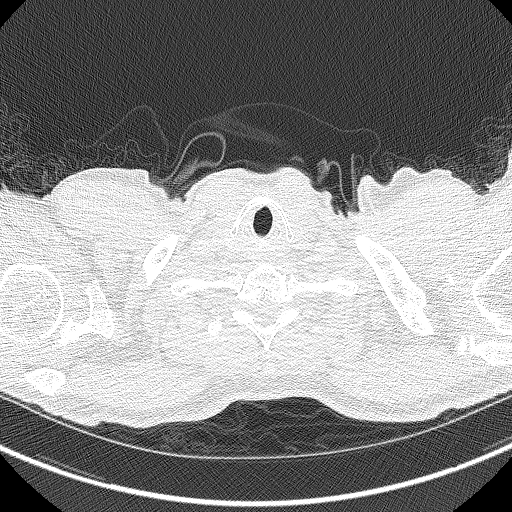

[Series 4: coronals lung 1.00 cor · coronal · 0.62mm/px · 3 of 251 slices shown]
[im 51/251  lung]
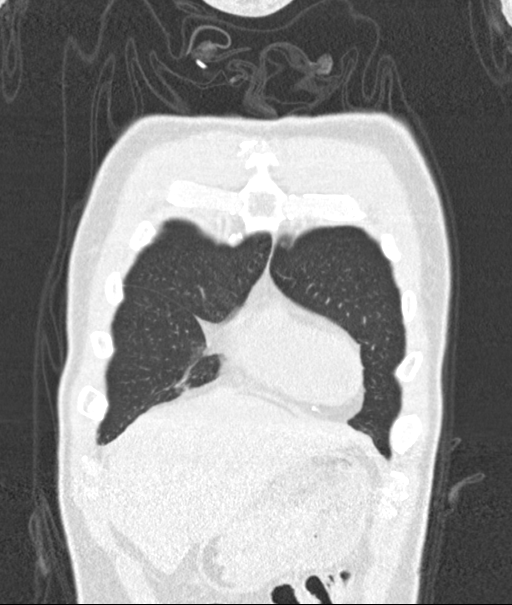
[im 101/251  lung]
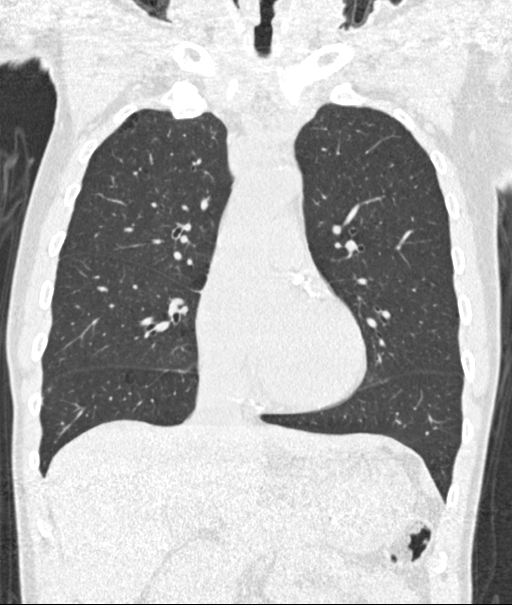
[im 151/251  lung]
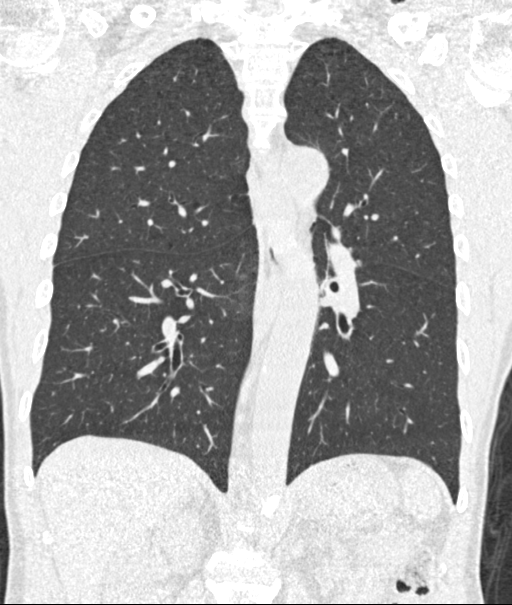

[15 of 40 positions shown; findings below may reference images not displayed]

FINDINGS: Cardiovascular: The heart is normal in size. No pericardial
effusion.

No evidence of thoracic aortic aneurysm. Atherosclerotic
calcifications of the aortic arch.

Three vessel coronary atherosclerosis.

Mediastinum/Nodes: No suspicious mediastinal lymphadenopathy.

Visualized thyroid is unremarkable.

Lungs/Pleura: Mild centrilobular and paraseptal emphysematous
changes, upper lung predominant.

No focal consolidation.

3.0 mm subpleural nodule in the anterior left lower lobe along the
major fissure.

No pleural effusion or pneumothorax.

Upper Abdomen: Visualized upper abdomen is grossly unremarkable,
noting vascular calcifications.

Musculoskeletal: Visualized osseous structures are within normal
limits.
IMPRESSION: Lung-RADS 2, benign appearance or behavior. Continue annual
screening with low-dose chest CT without contrast in 12 months.

Aortic Atherosclerosis (7GOCQ-FFF.F) and Emphysema (7GOCQ-G8X.0).

## 2021-04-03 ENCOUNTER — Encounter (INDEPENDENT_AMBULATORY_CARE_PROVIDER_SITE_OTHER): Payer: Medicare Other

## 2021-04-03 ENCOUNTER — Encounter (INDEPENDENT_AMBULATORY_CARE_PROVIDER_SITE_OTHER): Payer: Self-pay | Admitting: Vascular Surgery

## 2021-04-03 ENCOUNTER — Ambulatory Visit (INDEPENDENT_AMBULATORY_CARE_PROVIDER_SITE_OTHER): Payer: Medicare Other | Admitting: Vascular Surgery

## 2021-08-30 ENCOUNTER — Inpatient Hospital Stay (HOSPITAL_COMMUNITY): Payer: Medicare Other

## 2021-08-30 ENCOUNTER — Encounter: Admission: EM | Disposition: E | Payer: Self-pay | Source: Home / Self Care

## 2021-08-30 ENCOUNTER — Emergency Department: Payer: Medicare Other

## 2021-08-30 ENCOUNTER — Emergency Department
Admission: EM | Admit: 2021-08-30 | Discharge: 2021-09-03 | Disposition: E | Payer: Medicare Other | Attending: Emergency Medicine | Admitting: Emergency Medicine

## 2021-08-30 DIAGNOSIS — Z79899 Other long term (current) drug therapy: Secondary | ICD-10-CM

## 2021-08-30 DIAGNOSIS — I2109 ST elevation (STEMI) myocardial infarction involving other coronary artery of anterior wall: Secondary | ICD-10-CM

## 2021-08-30 DIAGNOSIS — N179 Acute kidney failure, unspecified: Secondary | ICD-10-CM | POA: Diagnosis present

## 2021-08-30 DIAGNOSIS — E1122 Type 2 diabetes mellitus with diabetic chronic kidney disease: Secondary | ICD-10-CM | POA: Diagnosis present

## 2021-08-30 DIAGNOSIS — Z7984 Long term (current) use of oral hypoglycemic drugs: Secondary | ICD-10-CM

## 2021-08-30 DIAGNOSIS — Z7902 Long term (current) use of antithrombotics/antiplatelets: Secondary | ICD-10-CM | POA: Insufficient documentation

## 2021-08-30 DIAGNOSIS — R570 Cardiogenic shock: Principal | ICD-10-CM | POA: Diagnosis present

## 2021-08-30 DIAGNOSIS — I2510 Atherosclerotic heart disease of native coronary artery without angina pectoris: Secondary | ICD-10-CM | POA: Diagnosis present

## 2021-08-30 DIAGNOSIS — R140 Abdominal distension (gaseous): Secondary | ICD-10-CM | POA: Insufficient documentation

## 2021-08-30 DIAGNOSIS — Z7982 Long term (current) use of aspirin: Secondary | ICD-10-CM

## 2021-08-30 DIAGNOSIS — J9602 Acute respiratory failure with hypercapnia: Secondary | ICD-10-CM | POA: Diagnosis present

## 2021-08-30 DIAGNOSIS — I959 Hypotension, unspecified: Secondary | ICD-10-CM | POA: Insufficient documentation

## 2021-08-30 DIAGNOSIS — J449 Chronic obstructive pulmonary disease, unspecified: Secondary | ICD-10-CM | POA: Diagnosis present

## 2021-08-30 DIAGNOSIS — I272 Pulmonary hypertension, unspecified: Secondary | ICD-10-CM | POA: Diagnosis not present

## 2021-08-30 DIAGNOSIS — Z833 Family history of diabetes mellitus: Secondary | ICD-10-CM

## 2021-08-30 DIAGNOSIS — I5023 Acute on chronic systolic (congestive) heart failure: Secondary | ICD-10-CM | POA: Diagnosis present

## 2021-08-30 DIAGNOSIS — J9601 Acute respiratory failure with hypoxia: Secondary | ICD-10-CM | POA: Diagnosis present

## 2021-08-30 DIAGNOSIS — E1151 Type 2 diabetes mellitus with diabetic peripheral angiopathy without gangrene: Secondary | ICD-10-CM | POA: Diagnosis present

## 2021-08-30 DIAGNOSIS — I5022 Chronic systolic (congestive) heart failure: Secondary | ICD-10-CM | POA: Diagnosis not present

## 2021-08-30 DIAGNOSIS — I130 Hypertensive heart and chronic kidney disease with heart failure and stage 1 through stage 4 chronic kidney disease, or unspecified chronic kidney disease: Secondary | ICD-10-CM | POA: Diagnosis present

## 2021-08-30 DIAGNOSIS — N1831 Chronic kidney disease, stage 3a: Secondary | ICD-10-CM | POA: Diagnosis present

## 2021-08-30 DIAGNOSIS — I213 ST elevation (STEMI) myocardial infarction of unspecified site: Secondary | ICD-10-CM | POA: Insufficient documentation

## 2021-08-30 DIAGNOSIS — I4901 Ventricular fibrillation: Secondary | ICD-10-CM | POA: Insufficient documentation

## 2021-08-30 DIAGNOSIS — R64 Cachexia: Secondary | ICD-10-CM | POA: Diagnosis present

## 2021-08-30 DIAGNOSIS — F17210 Nicotine dependence, cigarettes, uncomplicated: Secondary | ICD-10-CM | POA: Insufficient documentation

## 2021-08-30 DIAGNOSIS — K7200 Acute and subacute hepatic failure without coma: Secondary | ICD-10-CM | POA: Diagnosis present

## 2021-08-30 DIAGNOSIS — I351 Nonrheumatic aortic (valve) insufficiency: Secondary | ICD-10-CM | POA: Diagnosis not present

## 2021-08-30 DIAGNOSIS — G9340 Encephalopathy, unspecified: Secondary | ICD-10-CM | POA: Diagnosis present

## 2021-08-30 DIAGNOSIS — Z452 Encounter for adjustment and management of vascular access device: Secondary | ICD-10-CM

## 2021-08-30 DIAGNOSIS — I110 Hypertensive heart disease with heart failure: Secondary | ICD-10-CM | POA: Diagnosis not present

## 2021-08-30 DIAGNOSIS — I252 Old myocardial infarction: Secondary | ICD-10-CM

## 2021-08-30 DIAGNOSIS — I469 Cardiac arrest, cause unspecified: Secondary | ICD-10-CM | POA: Diagnosis present

## 2021-08-30 DIAGNOSIS — I462 Cardiac arrest due to underlying cardiac condition: Secondary | ICD-10-CM | POA: Diagnosis present

## 2021-08-30 DIAGNOSIS — Z20822 Contact with and (suspected) exposure to covid-19: Secondary | ICD-10-CM | POA: Diagnosis not present

## 2021-08-30 DIAGNOSIS — S27892A Contusion of other specified intrathoracic organs, initial encounter: Secondary | ICD-10-CM

## 2021-08-30 DIAGNOSIS — Z66 Do not resuscitate: Secondary | ICD-10-CM | POA: Diagnosis not present

## 2021-08-30 DIAGNOSIS — M7981 Nontraumatic hematoma of soft tissue: Secondary | ICD-10-CM | POA: Diagnosis present

## 2021-08-30 DIAGNOSIS — Z515 Encounter for palliative care: Secondary | ICD-10-CM

## 2021-08-30 HISTORY — PX: LEFT HEART CATH AND CORONARY ANGIOGRAPHY: CATH118249

## 2021-08-30 HISTORY — PX: RIGHT HEART CATH: CATH118263

## 2021-08-30 HISTORY — PX: CORONARY/GRAFT ACUTE MI REVASCULARIZATION: CATH118305

## 2021-08-30 HISTORY — PX: VENTRICULAR ASSIST DEVICE INSERTION: CATH118273

## 2021-08-30 LAB — BLOOD GAS, ARTERIAL
Acid-base deficit: 7.8 mmol/L — ABNORMAL HIGH (ref 0.0–2.0)
Bicarbonate: 21.9 mmol/L (ref 20.0–28.0)
FIO2: 100 %
MECHVT: 450 mL
O2 Saturation: 100 %
PEEP: 5 cmH2O
Patient temperature: 37
RATE: 16 resp/min
pCO2 arterial: 63 mmHg — ABNORMAL HIGH (ref 32–48)
pH, Arterial: 7.15 — CL (ref 7.35–7.45)
pO2, Arterial: 197 mmHg — ABNORMAL HIGH (ref 83–108)

## 2021-08-30 LAB — COMPREHENSIVE METABOLIC PANEL
ALT: 55 U/L — ABNORMAL HIGH (ref 0–44)
AST: 88 U/L — ABNORMAL HIGH (ref 15–41)
Albumin: 3.2 g/dL — ABNORMAL LOW (ref 3.5–5.0)
Alkaline Phosphatase: 159 U/L — ABNORMAL HIGH (ref 38–126)
Anion gap: 14 (ref 5–15)
BUN: 14 mg/dL (ref 8–23)
CO2: 26 mmol/L (ref 22–32)
Calcium: 9.1 mg/dL (ref 8.9–10.3)
Chloride: 98 mmol/L (ref 98–111)
Creatinine, Ser: 1.43 mg/dL — ABNORMAL HIGH (ref 0.61–1.24)
GFR, Estimated: 54 mL/min — ABNORMAL LOW (ref >60)
Glucose, Bld: 259 mg/dL — ABNORMAL HIGH (ref 70–99)
Potassium: 3.5 mmol/L (ref 3.5–5.1)
Sodium: 138 mmol/L (ref 135–145)
Total Bilirubin: 1.5 mg/dL — ABNORMAL HIGH (ref 0.3–1.2)
Total Protein: 6.2 g/dL — ABNORMAL LOW (ref 6.5–8.1)

## 2021-08-30 LAB — TROPONIN I (HIGH SENSITIVITY): Troponin I (High Sensitivity): 589 ng/L (ref <18)

## 2021-08-30 LAB — CBC WITH DIFFERENTIAL/PLATELET
Abs Immature Granulocytes: 1.88 10*3/uL — ABNORMAL HIGH (ref 0.00–0.07)
Basophils Absolute: 0.2 10*3/uL — ABNORMAL HIGH (ref 0.0–0.1)
Basophils Relative: 1 %
Eosinophils Absolute: 0.2 10*3/uL (ref 0.0–0.5)
Eosinophils Relative: 1 %
HCT: 41.5 % (ref 39.0–52.0)
Hemoglobin: 13.1 g/dL (ref 13.0–17.0)
Immature Granulocytes: 8 %
Lymphocytes Relative: 24 %
Lymphs Abs: 5.9 10*3/uL — ABNORMAL HIGH (ref 0.7–4.0)
MCH: 31.5 pg (ref 26.0–34.0)
MCHC: 31.6 g/dL (ref 30.0–36.0)
MCV: 99.8 fL (ref 80.0–100.0)
Monocytes Absolute: 0.6 10*3/uL (ref 0.1–1.0)
Monocytes Relative: 2 %
Neutro Abs: 16 10*3/uL — ABNORMAL HIGH (ref 1.7–7.7)
Neutrophils Relative %: 64 %
Platelets: 169 10*3/uL (ref 150–400)
RBC: 4.16 MIL/uL — ABNORMAL LOW (ref 4.22–5.81)
RDW: 13.9 % (ref 11.5–15.5)
Smear Review: NORMAL
WBC: 24 10*3/uL — ABNORMAL HIGH (ref 4.0–10.5)
nRBC: 0.2 % (ref 0.0–0.2)

## 2021-08-30 LAB — POCT I-STAT 7, (LYTES, BLD GAS, ICA,H+H)
Acid-base deficit: 7 mmol/L — ABNORMAL HIGH (ref 0.0–2.0)
Bicarbonate: 19.3 mmol/L — ABNORMAL LOW (ref 20.0–28.0)
Calcium, Ion: 0.91 mmol/L — ABNORMAL LOW (ref 1.15–1.40)
HCT: 29 % — ABNORMAL LOW (ref 39.0–52.0)
Hemoglobin: 9.9 g/dL — ABNORMAL LOW (ref 13.0–17.0)
O2 Saturation: 100 %
Patient temperature: 35.2
Potassium: 3 mmol/L — ABNORMAL LOW (ref 3.5–5.1)
Sodium: 145 mmol/L (ref 135–145)
TCO2: 21 mmol/L — ABNORMAL LOW (ref 22–32)
pCO2 arterial: 37.3 mmHg (ref 32–48)
pH, Arterial: 7.314 — ABNORMAL LOW (ref 7.35–7.45)
pO2, Arterial: 207 mmHg — ABNORMAL HIGH (ref 83–108)

## 2021-08-30 LAB — RESP PANEL BY RT-PCR (FLU A&B, COVID) ARPGX2
Influenza A by PCR: NEGATIVE
Influenza B by PCR: NEGATIVE
SARS Coronavirus 2 by RT PCR: NEGATIVE

## 2021-08-30 LAB — COOXEMETRY PANEL
Carboxyhemoglobin: 1.5 % (ref 0.5–1.5)
Methemoglobin: 0.7 % (ref 0.0–1.5)
O2 Saturation: 57 %
Total hemoglobin: 10.7 g/dL — ABNORMAL LOW (ref 12.0–16.0)

## 2021-08-30 LAB — POCT ACTIVATED CLOTTING TIME
Activated Clotting Time: 311 seconds
Activated Clotting Time: 269 seconds

## 2021-08-30 LAB — BRAIN NATRIURETIC PEPTIDE: B Natriuretic Peptide: 329.4 pg/mL — ABNORMAL HIGH (ref 0.0–100.0)

## 2021-08-30 LAB — MAGNESIUM: Magnesium: 2.3 mg/dL (ref 1.7–2.4)

## 2021-08-30 LAB — ETHANOL: Alcohol, Ethyl (B): <10 mg/dL (ref <10)

## 2021-08-30 LAB — CBG MONITORING, ED: Glucose-Capillary: 210 mg/dL — ABNORMAL HIGH (ref 70–99)

## 2021-08-30 LAB — LACTIC ACID, PLASMA: Lactic Acid, Venous: 5.7 mmol/L (ref 0.5–1.9)

## 2021-08-30 SURGERY — CORONARY/GRAFT ACUTE MI REVASCULARIZATION

## 2021-08-30 MED ORDER — MILRINONE LACTATE IN DEXTROSE 20-5 MG/100ML-% IV SOLN: 0.1250 ug/kg/min | INTRAVENOUS | Status: DC

## 2021-08-30 MED ORDER — FENTANYL 2500MCG IN NS 250ML (10MCG/ML) PREMIX INFUSION
0.0000 ug/h | INTRAVENOUS | Status: DC
  Administered 2021-08-30: 25 ug/h via INTRAVENOUS

## 2021-08-30 MED ORDER — FENTANYL 2500MCG IN NS 250ML (10MCG/ML) PREMIX INFUSION
INTRAVENOUS | Status: AC
  Filled 2021-08-30: qty 250

## 2021-08-30 MED ORDER — SODIUM BICARBONATE 8.4 % IV SOLN
INTRAVENOUS | Status: AC | PRN
  Administered 2021-08-30: 50 meq via INTRAVENOUS

## 2021-08-30 MED ORDER — CALCIUM CHLORIDE 10 % IV SOLN
INTRAVENOUS | Status: AC | PRN
  Administered 2021-08-30: 1 g via INTRAVENOUS

## 2021-08-30 MED ORDER — MIDAZOLAM HCL 2 MG/2ML IJ SOLN
INTRAMUSCULAR | Status: DC | PRN
  Administered 2021-08-30 (×4): 2 mg via INTRAVENOUS

## 2021-08-30 MED ORDER — ASPIRIN 300 MG RE SUPP
300.0000 mg | Freq: Once | RECTAL | Status: AC
  Administered 2021-08-30: 300 mg via RECTAL
  Filled 2021-08-30: qty 1

## 2021-08-30 MED ORDER — NOREPINEPHRINE BITARTRATE 1 MG/ML IV SOLN
INTRAVENOUS | Status: AC
  Filled 2021-08-30: qty 4

## 2021-08-30 MED ORDER — PROPOFOL 1000 MG/100ML IV EMUL: 0.0000 ug/kg/min | INTRAVENOUS | Status: DC

## 2021-08-30 MED ORDER — LORAZEPAM 2 MG/ML IJ SOLN
1.0000 mg | Freq: Once | INTRAMUSCULAR | Status: AC
  Filled 2021-08-30: qty 1

## 2021-08-30 MED ORDER — ACETAMINOPHEN 325 MG PO TABS: 650.0000 mg | ORAL_TABLET | ORAL | Status: DC | PRN

## 2021-08-30 MED ORDER — INSULIN ASPART 100 UNIT/ML IJ SOLN
INTRAMUSCULAR | Status: AC
  Filled 2021-08-30: qty 1

## 2021-08-30 MED ORDER — NOREPINEPHRINE 4 MG/250ML-% IV SOLN
INTRAVENOUS | Status: AC
  Administered 2021-08-30: 10 ug/min via INTRAVENOUS
  Filled 2021-08-30: qty 250

## 2021-08-30 MED ORDER — SODIUM CHLORIDE 0.9 % IV SOLN
250.0000 mL | INTRAVENOUS | Status: DC | PRN
Start: 2021-08-30 — End: 2021-08-30

## 2021-08-30 MED ORDER — FENTANYL CITRATE PF 50 MCG/ML IJ SOSY
PREFILLED_SYRINGE | INTRAMUSCULAR | Status: AC
  Filled 2021-08-30: qty 2

## 2021-08-30 MED ORDER — MIDAZOLAM HCL 2 MG/2ML IJ SOLN
INTRAMUSCULAR | Status: AC
  Filled 2021-08-30: qty 2

## 2021-08-30 MED ORDER — FENTANYL CITRATE (PF) 100 MCG/2ML IJ SOLN
INTRAMUSCULAR | Status: AC | PRN
  Administered 2021-08-30: 100 ug via INTRAVENOUS

## 2021-08-30 MED ORDER — EPINEPHRINE HCL 5 MG/250ML IV SOLN IN NS: 0.5000 ug/min | INTRAVENOUS | Status: DC

## 2021-08-30 MED ORDER — SODIUM CHLORIDE 0.9 % IV SOLN
INTRAVENOUS | Status: DC | PRN
  Administered 2021-08-30: 250 mL via INTRAVENOUS

## 2021-08-30 MED ORDER — AMIODARONE HCL IN DEXTROSE 360-4.14 MG/200ML-% IV SOLN
INTRAVENOUS | Status: AC
  Filled 2021-08-30: qty 200

## 2021-08-30 MED ORDER — SODIUM CHLORIDE 0.9 % IV SOLN
2.0000 g | INTRAVENOUS | Status: DC
  Filled 2021-08-30: qty 20

## 2021-08-30 MED ORDER — HEPARIN SODIUM (PORCINE) 5000 UNIT/ML IJ SOLN
INTRAMUSCULAR | Status: AC
  Filled 2021-08-30: qty 1

## 2021-08-30 MED ORDER — INSULIN ASPART 100 UNIT/ML IJ SOLN
INTRAMUSCULAR | Status: DC | PRN
  Administered 2021-08-30: 5 [IU] via SUBCUTANEOUS

## 2021-08-30 MED ORDER — HEPARIN (PORCINE) 25000 UT/250ML-% IV SOLN
INTRAVENOUS | Status: AC
  Filled 2021-08-30: qty 250

## 2021-08-30 MED ORDER — LIDOCAINE HCL (PF) 1 % IJ SOLN
INTRAMUSCULAR | Status: DC | PRN
  Administered 2021-08-30 (×2): 10 mL

## 2021-08-30 MED ORDER — SODIUM CHLORIDE 0.9% FLUSH: 3.0000 mL | INTRAVENOUS | Status: DC | PRN

## 2021-08-30 MED ORDER — HEPARIN SODIUM (PORCINE) 5000 UNIT/ML IJ SOLN: INTRAVENOUS | Status: DC

## 2021-08-30 MED ORDER — EPINEPHRINE HCL 5 MG/250ML IV SOLN IN NS
INTRAVENOUS | Status: AC
  Administered 2021-08-30: 5 mg
  Filled 2021-08-30: qty 250

## 2021-08-30 MED ORDER — LORAZEPAM 2 MG/ML IJ SOLN
INTRAMUSCULAR | Status: AC
  Administered 2021-08-30: 1 mg via INTRAVENOUS
  Filled 2021-08-30: qty 1

## 2021-08-30 MED ORDER — MIDAZOLAM-SODIUM CHLORIDE 100-0.9 MG/100ML-% IV SOLN
0.5000 mg/h | INTRAVENOUS | Status: DC
  Administered 2021-08-30: 0.5 mg/h via INTRAVENOUS
  Filled 2021-08-30: qty 100

## 2021-08-30 MED ORDER — EPINEPHRINE 1 MG/10ML IJ SOSY
PREFILLED_SYRINGE | INTRAMUSCULAR | Status: AC | PRN
  Administered 2021-08-30: 1 mg via INTRAVENOUS

## 2021-08-30 MED ORDER — NOREPINEPHRINE 4 MG/250ML-% IV SOLN: 0.0000 ug/min | INTRAVENOUS | Status: DC

## 2021-08-30 MED ORDER — SODIUM CHLORIDE 0.9 % IV BOLUS
1000.0000 mL | Freq: Once | INTRAVENOUS | Status: AC
  Administered 2021-08-30: 1000 mL via INTRAVENOUS

## 2021-08-30 MED ORDER — PANTOPRAZOLE SODIUM 40 MG IV SOLR: 40.0000 mg | Freq: Every day | INTRAVENOUS | Status: DC

## 2021-08-30 MED ORDER — SODIUM CHLORIDE 0.9% IV SOLUTION: INTRAVENOUS | Status: DC | PRN

## 2021-08-30 MED ORDER — IPRATROPIUM-ALBUTEROL 0.5-2.5 (3) MG/3ML IN SOLN: 3.0000 mL | Freq: Four times a day (QID) | RESPIRATORY_TRACT | Status: DC | PRN

## 2021-08-30 MED ORDER — SODIUM CHLORIDE 0.9% FLUSH: 3.0000 mL | Freq: Two times a day (BID) | INTRAVENOUS | Status: DC

## 2021-08-30 MED ORDER — HEPARIN SODIUM (PORCINE) 1000 UNIT/ML IJ SOLN
INTRAMUSCULAR | Status: AC
Start: 2021-08-30 — End: ?
  Filled 2021-08-30: qty 10

## 2021-08-30 MED ORDER — AMIODARONE HCL IN DEXTROSE 360-4.14 MG/200ML-% IV SOLN
60.0000 mg/h | INTRAVENOUS | Status: DC
  Administered 2021-08-30: 60 mg/h via INTRAVENOUS

## 2021-08-30 MED ORDER — MILRINONE LACTATE IN DEXTROSE 20-5 MG/100ML-% IV SOLN
INTRAVENOUS | Status: AC
  Administered 2021-08-30: 0.125 ug/kg/min via INTRAVENOUS
  Filled 2021-08-30: qty 100

## 2021-08-30 MED ORDER — AMIODARONE HCL IN DEXTROSE 360-4.14 MG/200ML-% IV SOLN: 30.0000 mg/h | INTRAVENOUS | Status: DC

## 2021-08-30 MED ORDER — HEPARIN (PORCINE) IN NACL 1000-0.9 UT/500ML-% IV SOLN
INTRAVENOUS | Status: AC
  Filled 2021-08-30: qty 1000

## 2021-08-30 MED ORDER — HEPARIN SODIUM (PORCINE) 5000 UNIT/ML IJ SOLN: 4000.0000 [IU] | Freq: Once | INTRAMUSCULAR | Status: DC

## 2021-08-30 MED ORDER — SODIUM BICARBONATE 8.4 % IV SOLN
50.0000 meq | Freq: Once | INTRAVENOUS | Status: AC
  Administered 2021-08-30: 50 meq via INTRAVENOUS

## 2021-08-30 MED ORDER — SODIUM BICARBONATE 8.4 % IV SOLN
INTRAVENOUS | Status: AC
Start: 2021-08-30 — End: 2021-08-30
  Administered 2021-08-30: 50 meq via INTRAVENOUS
  Filled 2021-08-30: qty 50

## 2021-08-30 MED ORDER — LIDOCAINE HCL 1 % IJ SOLN
INTRAMUSCULAR | Status: AC
  Filled 2021-08-30: qty 20

## 2021-08-30 MED ORDER — HEPARIN SODIUM (PORCINE) 1000 UNIT/ML IJ SOLN
INTRAMUSCULAR | Status: DC | PRN
  Administered 2021-08-30: 2000 [IU] via INTRAVENOUS
  Administered 2021-08-30: 4000 [IU] via INTRAVENOUS

## 2021-08-30 MED ORDER — SODIUM CHLORIDE 0.9 % IV SOLN
500.0000 mg | INTRAVENOUS | Status: DC
  Filled 2021-08-30: qty 5

## 2021-08-30 MED ORDER — INSULIN ASPART 100 UNIT/ML IJ SOLN: 0.0000 [IU] | INTRAMUSCULAR | Status: DC

## 2021-08-30 MED ORDER — PROPOFOL 1000 MG/100ML IV EMUL
INTRAVENOUS | Status: AC
  Filled 2021-08-30: qty 100

## 2021-08-30 MED ORDER — HEPARIN BOLUS VIA INFUSION: 4000.0000 [IU] | Freq: Once | INTRAVENOUS | Status: DC

## 2021-08-30 MED ORDER — HEPARIN (PORCINE) IN NACL 2000-0.9 UNIT/L-% IV SOLN
INTRAVENOUS | Status: DC | PRN
  Administered 2021-08-30: 1000 mL

## 2021-08-30 MED ORDER — DEXTROSE 5 % SOLN FOR IMPELLA PURGE CATHETER
INTRAVENOUS | Status: DC
  Filled 2021-08-30: qty 1000

## 2021-08-30 MED ORDER — POLYETHYLENE GLYCOL 3350 17 G PO PACK: 17.0000 g | PACK | Freq: Every day | ORAL | Status: DC

## 2021-08-30 MED ORDER — ONDANSETRON HCL 4 MG/2ML IJ SOLN
4.0000 mg | Freq: Four times a day (QID) | INTRAMUSCULAR | Status: DC | PRN
Start: 2021-08-30 — End: 2021-08-30

## 2021-08-30 MED ORDER — FENTANYL 2500MCG IN NS 250ML (10MCG/ML) PREMIX INFUSION: 25.0000 ug/h | INTRAVENOUS | Status: DC

## 2021-08-30 MED ORDER — NOREPINEPHRINE 16 MG/250ML-% IV SOLN
0.0000 ug/min | INTRAVENOUS | Status: DC
  Administered 2021-08-30: 90 ug/min via INTRAVENOUS
  Administered 2021-08-30: 65 ug/min via INTRAVENOUS
  Filled 2021-08-30: qty 500
  Filled 2021-08-30 (×2): qty 250

## 2021-08-30 MED ORDER — FENTANYL BOLUS VIA INFUSION
25.0000 ug | INTRAVENOUS | Status: DC | PRN
  Filled 2021-08-30: qty 100

## 2021-08-30 MED ORDER — IOHEXOL 300 MG/ML  SOLN
INTRAMUSCULAR | Status: DC | PRN
  Administered 2021-08-30: 64 mL

## 2021-08-30 MED ORDER — DOCUSATE SODIUM 50 MG/5ML PO LIQD: 100.0000 mg | Freq: Two times a day (BID) | ORAL | Status: DC

## 2021-08-30 SURGICAL SUPPLY — 22 items
CATH INFINITI 5FR ANG PIGTAIL (CATHETERS) ×1 IMPLANT
CATH INFINITI 5FR JL4 (CATHETERS) ×1 IMPLANT
CATH INFINITI JR4 5F (CATHETERS) ×1 IMPLANT
CATH SWAN GANZ VIP 7.5F (CATHETERS) ×1 IMPLANT
DRAPE BRACHIAL (DRAPES) ×1 IMPLANT
GLIDESHEATH SLEND SS 6F .021 (SHEATH) IMPLANT
GUIDEWIRE INQWIRE 1.5J.035X260 (WIRE) IMPLANT
IMMOB KNEE 14 THIGH 24 706614 (SOFTGOODS) ×1 IMPLANT
INQWIRE 1.5J .035X260CM (WIRE)
KIT ENCORE 26 ADVANTAGE (KITS) ×1 IMPLANT
KIT SYRINGE INJ CVI SPIKEX1 (MISCELLANEOUS) ×1 IMPLANT
NDL PERC 18GX7CM (NEEDLE) IMPLANT
NEEDLE PERC 18GX7CM (NEEDLE) ×2 IMPLANT
PROTECTION STATION PRESSURIZED (MISCELLANEOUS) ×2
SET ATX SIMPLICITY (MISCELLANEOUS) ×1 IMPLANT
SET IMPELLA CP PUMP (CATHETERS) ×1 IMPLANT
SHEATH AVANTI 6FR X 11CM (SHEATH) ×2 IMPLANT
SHEATH BRITE TIP 8FR 5.5 (SHEATH) IMPLANT
SHEATH BRITE TIP 8FRX11 (SHEATH) ×1 IMPLANT
STATION PROTECTION PRESSURIZED (MISCELLANEOUS) IMPLANT
TUBING CIL FLEX 10 FLL-RA (TUBING) ×1 IMPLANT
WIRE GUIDERIGHT .035X150 (WIRE) ×2 IMPLANT

## 2021-08-31 LAB — ECHO TEE
Height: 65 in
Weight: 2462.1 oz

## 2021-09-01 ENCOUNTER — Encounter: Payer: Self-pay | Admitting: Interventional Cardiology

## 2021-09-02 ENCOUNTER — Encounter: Payer: Self-pay | Admitting: Interventional Cardiology

## 2021-09-03 LAB — BLOOD GAS, ARTERIAL
Acid-Base Excess: 3.8 mmol/L — ABNORMAL HIGH (ref 0.0–2.0)
Bicarbonate: 24.1 mmol/L (ref 20.0–28.0)
FIO2: 1 %
MECHVT: 450 mL
Mechanical Rate: 20
O2 Saturation: 100 %
PEEP: 5 cmH2O
Patient temperature: 37.1
RATE: 20 resp/min
pCO2 arterial: 55 mmHg — ABNORMAL HIGH (ref 32–48)
pH, Arterial: 7.25 — ABNORMAL LOW (ref 7.35–7.45)
pO2, Arterial: 288 mmHg — ABNORMAL HIGH (ref 83–108)

## 2021-09-03 NOTE — Consult Note (Signed)
NAME:  Evan [facility]., MRN:  [id number], DOB:  [birth year], LOS: 0 ADMISSION DATE:  09/12/2021, CONSULTATION DATE:  2021/09/12 REFERRING MD:  Dr, Eldridge Dace, CHIEF COMPLAINT:  Cardiac arrest   History of Present Illness:  [name] is a 66 y/o gentleman with a PMHx of HFmrEF, PAD, T2DM, HTN, TUB, cognitive delay who presented initially to [facility] s/p out-of-hospital cardiac arrest. History obtained through chart review.   Patient was evaluated by POA at home at 3:15 AM and found to be unresponsive. At 3:00 AM, he was in his normal state of health. On fire department arrival patient was in cardiac arrest and CPR was started.  Per EMS, patient was intermittently in PEA and ventricular fibrillation.  He received a total of 4 shocks for V fib.  They report that they performed CPR two separate times for about 15 to 20 minutes each time before they would lose pulses again and have to start CPR.  They report that they performed CPR for probably about 40 minutes total on 3 separate occasions. EMS reports they gave him multiple rounds of epinephrine and 300 mg of amiodarone.  Blood glucose in the 200s.  EMS sent in an EKG that shows lateral STEMI during one of the episodes of ROSC.  They lost pulses just about 10 minutes prior to arrival to the ED and CPR continued on arrival to ER.   Total CPR time ~ 1 hr.   Pertinent  Medical History   Past Medical History:  Diagnosis Date   Diabetes mellitus without complication (HCC)    Hypertension    Peripheral vascular disease (HCC)    Significant Hospital Events: Including procedures, antibiotic start and stop dates in addition to other pertinent events   2/25: Out of hospital cardiac arrest. Admitted to [facility].   Interim History / Subjective:  Admitted.  Objective   There were no vitals taken for this visit.    Vent Mode: PRVC FiO2 (%):  [100 %] 100 % Set Rate:  [16 bmp-20 bmp] 20 bmp Vt Set:  [450 mL] 450 mL PEEP:  [5 cmH20] 5 cmH20  No intake or  output data in the 24 hours ending 09-12-21 0955 There were no vitals filed for this visit.  Examination: General: Acute on chronically ill appearing male. Appears older than stated age.  HENT: Moist mucus membranes  Lungs: Coarse breath sounds bilaterally. Breathe sounds present throughout. Ventilated.  Cardiovascular: Very diminished breathe sounds.  Abdomen: Hypoactive bowel sounds. Non-distended Extremities: Cool to touch in lower extremities. No pitting edema.  Neuro: Minimally responsive pupillary reflex and corneal reflex. No purposeful movement.  GU: Deferred   Resolved Hospital Problem list   N/A  Assessment & Plan:   # PEA and V-Fib Cardiac Arrest  # Anterolateral STEMI  # Cardiogenic Shock  # ? Mediastinal Hematoma - Out of hospital arrest with initial rhythm PEA with intermittent V. Fib per EMS. Received 4 shocks and several rounds of Epi. Total down time between 45 minutes to 1 hour - Emergent cath with evidence of severe three vessel disease with EF of 25%. Potential transient LAD occlusion and chronic circumflex occlusion.  - Impella placed - Bedside TEE with potential mediastinal hematoma. Obtaining CT chest/abdomen/pelvis. Awaiting official read.  - Pressor support with Levo, Epi and Milrinone  - Management per Cardiology and TCTS   # Acute Hypoxic and Hypercarbic Respiratory Failure - In the setting of cardiogenic shock and cardiac arrest - CXR with bilateral upper lobe opacities, suspected  pulmonary contusions given CPR versus pneumonia (less likely). Review of CT chest most suspicious for contusion vs aspiration event.  - Will start empiric CAP coverage with Rocephin and Azithromycin  - Tracheal aspirate culture and MRSA PCR pending - Continue full vent support - Wean FiO2 as tolerated. Currently 60%.  - Hold SBT till hemodynamic stability is achieved  - VAP measures in place - Continue sedation with Propofol and Fentanyl  - Repeat ABG and CXR in the AM  #  Acute Encephalopathy - High risk for anoxic brain injury  - Will consider MRI brain in 72 hours   # Acute Kidney Injury  - Suspected ATN - Trend creatinine daily - Strict UOP - Continue foley catheter   # Shock Liver - Continue to monitor LFTs  # Type 2 Diabetes - A1c pending - SSI q4h (sensitive scale)   Best Practice (right click and "Reselect all SmartList Selections" daily)   Diet/type: NPO DVT prophylaxis: not indicated GI prophylaxis: PPI Lines: Per primary team Foley:  Yes, and it is still needed Code Status:  full code Last date of multidisciplinary goals of care discussion [Per primary team]  Labs   CBC: Recent Labs  Lab 2021/09/14 0444  WBC 24.0*  NEUTROABS 16.0*  HGB 13.1  HCT 41.5  MCV 99.8  PLT 169    Basic Metabolic Panel: Recent Labs  Lab September 14, 2021 0444  NA 138  K 3.5  CL 98  CO2 26  GLUCOSE 259*  BUN 14  CREATININE 1.43*  CALCIUM 9.1  MG 2.3   GFR: Estimated Creatinine Clearance: 44.2 mL/min (A) (by C-G formula based on SCr of 1.43 mg/dL (H)). Recent Labs  Lab September 14, 2021 0444  WBC 24.0*    Liver Function Tests: Recent Labs  Lab 09-14-2021 0444  AST 88*  ALT 55*  ALKPHOS 159*  BILITOT 1.5*  PROT 6.2*  ALBUMIN 3.2*   No results for input(s): LIPASE, AMYLASE in the last 168 hours. No results for input(s): AMMONIA in the last 168 hours.  ABG    Component Value Date/Time   PHART 7.15 (LL) 2021-09-14 0458   PCO2ART 63 (H) 09/14/2021 0458   PO2ART 197 (H) 09/14/21 0458   HCO3 21.9 09/14/21 0458   ACIDBASEDEF 7.8 (H) 09-14-2021 0458   O2SAT 100 2021/09/14 0458     Coagulation Profile: No results for input(s): INR, PROTIME in the last 168 hours.  Cardiac Enzymes: No results for input(s): CKTOTAL, CKMB, CKMBINDEX, TROPONINI in the last 168 hours.  HbA1C: No results found for: HGBA1C  CBG: Recent Labs  Lab 09-14-2021 0441  GLUCAP 210*   Review of Systems:   Negative except as noted above  Past Medical History:   He,  has a past medical history of Diabetes mellitus without complication (HCC), Hypertension, and Peripheral vascular disease (HCC).   Surgical History:   Past Surgical History:  Procedure Laterality Date   COLONOSCOPY     ESOPHAGOGASTRODUODENOSCOPY (EGD) WITH PROPOFOL N/A 06/01/2016   Procedure: ESOPHAGOGASTRODUODENOSCOPY (EGD) WITH PROPOFOL;  Surgeon: Scot Jun, MD;  Location[facility] ENDOSCOPY;  Service: Endoscopy;  Laterality: N/A;   LOWER EXTREMITY ANGIOGRAPHY Right 06/07/2019   Procedure: LOWER EXTREMITY ANGIOGRAPHY;  Surgeon: Renford Dills, MD;  Location: [facility] INVASIVE CV LAB;  Service: Cardiovascular;  Laterality: Right;     Social History:   reports that he has been smoking cigarettes. He has a 5.00 pack-year smoking history. He has never used smokeless tobacco. He reports current alcohol use. He reports that he  does not use drugs.   Family History:  His family history includes Diabetes in his sister.   Allergies No Known Allergies   Home Medications  Prior to Admission medications   Medication Sig Start Date End Date Taking? Authorizing Provider  albuterol (VENTOLIN HFA) 108 (90 Base) MCG/ACT inhaler Inhale into the lungs every 6 (six) hours as needed for wheezing or shortness of breath.    [provider]  aspirin EC 81 MG tablet Take 81 mg by mouth daily.    [provider]  atorvastatin (LIPITOR) 40 MG tablet Take 40 mg by mouth daily.    [provider]  clopidogrel (PLAVIX) 75 MG tablet Take 1 tablet (75 mg total) by mouth daily. 06/08/19   Schnier, Latina Craver, MD  gabapentin (NEURONTIN) 300 MG capsule TAKE 1 CAPSULE BY MOUTH 3 TIMES A DAY. 05/08/20   Felecia Shelling, DPM  lisinopril (PRINIVIL,ZESTRIL) 10 MG tablet Take 1 tablet (10 mg total) by mouth daily. 06/02/16   Alford Highland, MD  metformin (FORTAMET) 1000 MG (OSM) 24 hr tablet Take 1,000 mg by mouth daily with breakfast.    [provider]  nicotine (NICODERM CQ -  DOSED IN MG/24 HOURS) 21 mg/24hr patch Place 1 patch (21 mg total) onto the skin daily. 06/02/16   Alford Highland, MD  pantoprazole (PROTONIX) 40 MG tablet Take 1 tablet (40 mg total) by mouth 2 (two) times daily. 06/02/16   Alford Highland, MD  sitaGLIPtin-metformin (JANUMET) 50-1000 MG tablet Take 1 tablet by mouth 2 (two) times daily with a meal.    [provider]    Dr. Verdene Lennert Internal Medicine PGY-3  09-12-2021, 9:55 AM

## 2021-09-03 NOTE — ED Provider Notes (Addendum)
[facility] Provider Note    None    (approximate)   History   Cardiac Arrest   HPI  Evan Rose. is a 66 y.o. male with history of hypertension, diabetes, peripheral vascular disease who presents to the emergency department with EMS in cardiac arrest.  History is provided by EMS and then later by patient's power of attorney.  Power of attorney reports that patient was doing well at 3 AM and then she checked on him again at 3:15 AM and found him unresponsive.  On fire department arrival patient was in cardiac arrest and CPR was started.  Per EMS, patient was intermittently in PEA and ventricular fibrillation.  He received a total of 4 shocks for V fib.  They report that they performed CPR two separate times for about 15 to 20 minutes each time before they would lose pulses again and have to start CPR.  They report that they performed CPR for probably about 40 minutes total on 3 separate occasions. EMS reports they gave him multiple rounds of epinephrine and 300 mg of amiodarone.  Blood glucose in the 200s.  EMS sent in an EKG that shows lateral STEMI during one of the episodes of ROSC.  They lost pulses just about 10 minutes prior to arrival to the ED and CPR is being performed currently.   History provided by EMS and patient's power of attorney.    Past Medical History:  Diagnosis Date   Diabetes mellitus without complication ([place])    Hypertension    Peripheral vascular disease ([place])     Past Surgical History:  Procedure Laterality Date   COLONOSCOPY     ESOPHAGOGASTRODUODENOSCOPY (EGD) WITH PROPOFOL N/A 06/01/2016   Procedure: ESOPHAGOGASTRODUODENOSCOPY (EGD) WITH PROPOFOL;  Surgeon: Manya Silvas, MD;  Location: [place];  Service: Endoscopy;  Laterality: N/A;   LOWER EXTREMITY ANGIOGRAPHY Right 06/07/2019   Procedure: LOWER EXTREMITY ANGIOGRAPHY;  Surgeon: Katha Cabal, MD;  Location: [facility];  Service: Cardiovascular;   Laterality: Right;    MEDICATIONS:  Prior to Admission medications   Medication Sig Start Date End Date Taking? Authorizing Provider  albuterol (VENTOLIN HFA) 108 (90 Base) MCG/ACT inhaler Inhale into the lungs every 6 (six) hours as needed for wheezing or shortness of breath.    [provider]  aspirin EC 81 MG tablet Take 81 mg by mouth daily.    [provider]  atorvastatin (LIPITOR) 40 MG tablet Take 40 mg by mouth daily.    [provider]  clopidogrel (PLAVIX) 75 MG tablet Take 1 tablet (75 mg total) by mouth daily. 06/08/19   Schnier, Dolores Lory, MD  gabapentin (NEURONTIN) 300 MG capsule TAKE 1 CAPSULE BY MOUTH 3 TIMES A DAY. 05/08/20   Edrick Kins, DPM  lisinopril (PRINIVIL,ZESTRIL) 10 MG tablet Take 1 tablet (10 mg total) by mouth daily. 06/02/16   Loletha Grayer, MD  metformin (FORTAMET) 1000 MG (OSM) 24 hr tablet Take 1,000 mg by mouth daily with breakfast.    [provider]  nicotine (NICODERM CQ - DOSED IN MG/24 HOURS) 21 mg/24hr patch Place 1 patch (21 mg total) onto the skin daily. 06/02/16   Loletha Grayer, MD  pantoprazole (PROTONIX) 40 MG tablet Take 1 tablet (40 mg total) by mouth 2 (two) times daily. 06/02/16   Loletha Grayer, MD  sitaGLIPtin-metformin (JANUMET) 50-1000 MG tablet Take 1 tablet by mouth 2 (two) times daily with a meal.    [provider]    Physical Exam   Triage Vital Signs: ED Triage Vitals  Enc Vitals Group     BP September 21, 2021 0448 (!) 155/91     Pulse Rate 09-21-2021 0448 87     Resp 2021/09/21 0448 20     Temp --      Temp src --      SpO2 09-21-2021 0448 100 %     Weight Sep 21, 2021 0449 153 lb 14.1 oz (69.8 kg)     Height 09/21/21 0448 5\' 5"  (1.651 m)     Head Circumference --      Peak Flow --      Pain Score --      Pain Loc --      Pain Edu? --      Excl. in [place]? --     Most recent vital signs: Vitals:   2021-09-21 0742 September 21, 2021 0743  BP: (!) 127/113 (!) 127/113  Pulse: 69 69  Resp: (!) 25  (!) 25  SpO2: 100% 98%    CONSTITUTIONAL: Patient will open his eyes intermittently and is breathing on his own after we obtained ROSC. HEAD: Normocephalic, atraumatic EYES: Conjunctivae clear, pupils appear equal, sclera nonicteric ENT: normal nose; moist mucous membranes NECK: Trachea midline CARD: Initially pulseless getting CPR on arrival to the ED but upon ROSC patient was in a sinus rhythm and heart sounds normal RESP: Slightly rhonchorous breath sounds bilaterally, patient breathing on his own after ROSC obtained ABD/GI: Slightly distended abdomen that is soft BACK: The back appears normal EXT: Normal ROM in all joints; no deformity noted, no edema; no cyanosis SKIN: Normal color for age and race; warm; no rash on exposed skin NEURO: Opening eyes intermittently but not answering questions, following commands.  Appears to intermittently be having myoclonic jerks.    ED Results / Procedures / Treatments   LABS: (all labs ordered are listed, but only abnormal results are displayed) Labs Reviewed  CBC WITH DIFFERENTIAL/PLATELET - Abnormal; Notable for the following components:      Result Value   WBC 24.0 (*)    RBC 4.16 (*)    Neutro Abs 16.0 (*)    Lymphs Abs 5.9 (*)    Basophils Absolute 0.2 (*)    Abs Immature Granulocytes 1.88 (*)    All other components within normal limits  COMPREHENSIVE METABOLIC PANEL - Abnormal; Notable for the following components:   Glucose, Bld 259 (*)    Creatinine, Ser 1.43 (*)    Total Protein 6.2 (*)    Albumin 3.2 (*)    AST 88 (*)    ALT 55 (*)    Alkaline Phosphatase 159 (*)    Total Bilirubin 1.5 (*)    GFR, Estimated 54 (*)    All other components within normal limits  BRAIN NATRIURETIC PEPTIDE - Abnormal; Notable for the following components:   B Natriuretic Peptide 329.4 (*)    All other components within normal limits  BLOOD GAS, ARTERIAL - Abnormal; Notable for the following components:   pH, Arterial 7.15 (*)    pCO2  arterial 63 (*)    pO2, Arterial 197 (*)    Acid-base deficit 7.8 (*)    All other components within normal limits  CBG MONITORING, ED - Abnormal; Notable for the following components:   Glucose-Capillary 210 (*)    All other components within normal limits  TROPONIN I (HIGH SENSITIVITY) - Abnormal; Notable for the following components:   Troponin I (High Sensitivity) 589 (*)  All other components within normal limits  RESP PANEL BY RT-PCR (FLU A&B, COVID) ARPGX2  MAGNESIUM  ETHANOL  BLOOD GAS, ARTERIAL  POCT ACTIVATED CLOTTING TIME  POCT ACTIVATED CLOTTING TIME  TROPONIN I (HIGH SENSITIVITY)     EKG:  EKG Interpretation  Date/Time:  09-Sep-2021 05:00:00 EST Ventricular Rate:  76 PR Interval:  203 QRS Duration: 115 QT Interval:  444 QTC Calculation: 500 R Axis:   -22 Text Interpretation: Sinus rhythm Nonspecific intraventricular conduction delay Anterolateral infarct, acute (LAD) >>> Acute MI <<< Confirmed by Pryor Curia [phone]) on 09-09-21 9:15:18 AM         RADIOLOGY: My personal review and interpretation of imaging: Chest x-ray shows diffuse interstitial markings concerning for edema versus pneumonia.  Endotracheal tube in place.  I have personally reviewed all radiology reports.   CARDIAC CATHETERIZATION  Result Date: 09-09-21   Mid LM to Dist LM lesion is 80% stenosed.   Ost Cx to Prox Cx lesion is 100% stenosed.   Ramus lesion is 75% stenosed.   Dist LAD lesion is 70% stenosed.   Ost LM to Mid LM lesion is 50% stenosed.   Dist LM to Ost LAD lesion is 80% stenosed.   RPAV lesion is 75% stenosed.   Prox RCA lesion is 75% stenosed.   There is severe left ventricular systolic dysfunction.   LV end diastolic pressure is moderately elevated.   The left ventricular ejection fraction is less than 25% by visual estimate.   Hemodynamic findings consistent with mild pulmonary hypertension.   There is no aortic valve stenosis.   Aortic saturation 100%, PA  saturation 73%, PA pressure 42/13, mean PA pressure 26 mmHg, mean pulmonary capillary wedge pressure 13 mmHg, cardiac output 4.97 L/min, cardiac index 2.8.  These values are obtained after Impella insertion. Severe three-vessel coronary artery disease detected in the setting of VF arrest and cardiogenic shock.  He now has TIMI-3 flow down his LAD which I suspect was transiently occluded.  His circumflex is chronically occluded with right to left collaterals and left to left collaterals.  Impella placed to provide hemodynamic support.  Weaning Levophed. Further plans will be based on neurologic recovery.  Will need cardiac surgery consultation if he makes a significant neurologic recovery.   DG Chest Portable 1 View  Result Date: 09-09-2021 CLINICAL DATA:  Intubation EXAM: PORTABLE CHEST 1 VIEW COMPARISON:  Chest CT 05/06/2020 FINDINGS: Endotracheal tube with tip just below the clavicular heads. The enteric tube tip and side-port reaches the stomach. Bilateral airspace disease, confluent in the left and mainly at the upper lobe on the right. Normal heart size. Large lung volumes. No visible effusion or air leak. Gas distended stomach IMPRESSION: 1. Unremarkable hardware. 2. Bilateral airspace disease with asymmetry suggesting pneumonia, worse on the left. 3. Gas distended stomach. Electronically Signed   By: Jorje Guild M.D.   On: September 09, 2021 05:17     PROCEDURES:  Critical Care performed: Yes, see critical care procedure note(s)   CRITICAL CARE Performed by: Cyril Mourning Tejon Gracie   Total critical care time: 45 minutes  Critical care time was exclusive of separately billable procedures and treating other patients.  Critical care was necessary to treat or prevent imminent or life-threatening deterioration.  Critical care was time spent personally by me on the following activities: development of treatment plan with patient and/or surrogate as well as nursing, discussions with consultants, evaluation of  patient's response to treatment, examination of patient, obtaining history from patient or  surrogate, ordering and performing treatments and interventions, ordering and review of laboratory studies, ordering and review of radiographic studies, pulse oximetry and re-evaluation of patient's condition.   Cardiopulmonary Resuscitation (CPR) Procedure Note Directed/Performed by: Pryor Curia I personally directed ancillary staff and/or performed CPR in an effort to regain return of spontaneous circulation and to maintain cardiac, neuro and systemic perfusion.    Marland Kitchen1-3 Lead EKG Interpretation Performed by: Ledell Codrington, Delice Bison, DO Authorized by: Londan Coplen, Delice Bison, DO     Interpretation: abnormal     ECG rate:  69   ECG rate assessment: normal     Rhythm: sinus rhythm     Ectopy: none     Conduction: normal   Comments:     ST elevation seen on cardiac monitor Procedure Name: Intubation Date/Time: September 17, 2021 5:30 AM Performed by: Dayvon Dax, Delice Bison, DO Pre-anesthesia Checklist: Patient identified, Patient being monitored, Emergency Drugs available, Timeout performed and Suction available Oxygen Delivery Method: Ambu bag Preoxygenation: Pre-oxygenation with 100% oxygen Laryngoscope Size: Glidescope and 3 Grade View: Grade II Tube size: 7.5 mm Number of attempts: 1 Placement Confirmation: ETT inserted through vocal cords under direct vision, CO2 detector and Breath sounds checked- equal and bilateral Secured at: 24 cm Tube secured with: ETT holder       IMPRESSION / MDM / [place] / ED COURSE  I reviewed the triage vital signs and the nursing notes.    Patient here status postcardiac arrest with V-fib and PEA arrest status post 4 episodes of being defibrillated, 300 mg of amiodarone, multiple rounds of epinephrine.  EKG showed lateral ST elevation MI.  The patient is on the cardiac monitor to evaluate for evidence of arrhythmia and/or significant heart rate  changes.   DIFFERENTIAL DIAGNOSIS (includes but not limited to):   Cardiac arrest from MI, PE, dissection, sepsis   PLAN: CBC, CMP, troponin, BNP, chest x-ray ordered.  Patient getting active CPR upon arrival to the ED.  King airway was in place.  This was exchanged for endotracheal tube.  ROSC was obtained about 5 minutes of CPR and 1 round of epinephrine, bicarb and calcium in the ED.  Repeat EKG showed lateral ST elevation MI.  Code STEMI was activated by [facility] before patient arrived to the ED.  Spoke with Dr. Irish Lack with cardiology who will take patient emergently to the Cath Lab.  Patient's power of attorney has been updated.  Chest x-ray reviewed by myself and radiology shows diffuse interstitial markings concerning for edema versus pneumonia.  Patient having intermittent myoclonic jerks here.  Given 2 mg of IV Ativan.  He also appeared to have some signs of neurologic function and was opening his eyes intermittently, breathing on his own and biting the endotracheal tube.  Fentanyl started for sedation.  No ventricular fibrillation or arrhythmia noted here but given V-fib arrest noted by EMS, amiodarone infusion initiated.   Patient did have hypotension and peripheral Levophed was initiated.   Patient's labs show a respiratory acidosis.  We did increase his respiratory rate on the ventilator settings.  Troponin significantly elevated at 589.  Patient received rectal aspirin and heparin bolus in the ED.  Blood glucose here normal.   MEDICATIONS GIVEN IN ED: Medications  EPINEPHrine (ADRENALIN) 1 MG/10ML injection (1 mg Intravenous Given Sep 17, 2021 0438)  sodium bicarbonate injection (50 mEq Intravenous Given 09/17/2021 0438)  calcium chloride injection (1 g Intravenous Given September 17, 2021 0439)  fentaNYL (SUBLIMAZE) injection (100 mcg Intravenous Given 17-Sep-2021 0451)  aspirin suppository 300 mg (  300 mg Rectal Given 09/04/21 0501)  LORazepam (ATIVAN) injection 1 mg (1 mg Intravenous Given  September 04, 2021 0503)  sodium chloride 0.9 % bolus 1,000 mL (1,000 mLs Intravenous New Bag/Given Sep 04, 2021 0510)  sodium bicarbonate injection 50 mEq (50 mEq Intravenous Given 09-04-21 0512)     ED COURSE: Patient here status postcardiac arrest from a lateral myocardial infarction.  CPR was being performed upon arrival to the ED but we obtained ROSC after about 5 minutes.  Patient on fentanyl for sedation, Levophed for hypotension.  Patient going emergently to the cardiac catheterization lab.   CONSULTS: Cardiology consulted.   OUTSIDE RECORDS REVIEWED: Reviewed patient's last operative note with vascular surgery with Dr. Delana Meyer on 06/07/2019 for peripheral arterial disease, claudication.         FINAL CLINICAL IMPRESSION(S) / ED DIAGNOSES   Final diagnoses:  Cardiac arrest ([place])  Acute respiratory failure with hypoxia and hypercapnia (HCC)  ST elevation myocardial infarction (STEMI), unspecified artery ([place])     Rx / DC Orders   ED Discharge Orders     None        Note:  This document was prepared using Dragon voice recognition software and may include unintentional dictation errors.   Chanda Laperle, Delice Bison, DO 2021-09-04 [place], Delice Bison, DO 09-04-21 [phone]

## 2021-09-03 NOTE — Progress Notes (Addendum)
°  CXR with severe bilateral upper lobe airspace disease concerning for aspiration. Widened mediastinum.   Echo reveals EF 20% with global Hk. RV severe HK. Impella well placed. Apparent large mediastinal hematoma compressing RV  [place] numbers done personally RA 16 PA 49/30 (38) PCWP 16 (impella in place) CO 2.7 CI 1.5 (Impella on P-7) PVR 7.3 WU SVR 2285  TEE done emergently at bedside confirms EF 20% with large anterior mediastinal hematoma compressing RV/RA/LA. No aortic dissection.   Patient on on high-dose NE and Epi in setting of severe biventricular dysfunction and tamponade physiology.   Discussed with CT surgery. Not candidate for surgery/CABG with severe underlying lung disease, cachexia and MSOF  I discussed case with HCPOA and have made decision to transition to comfort care once they arrive  Additional CCT 70 mins  Arvilla Meres, MD  12:31 PM

## 2021-09-03 NOTE — ED Notes (Signed)
Pt to cath lab.

## 2021-09-03 NOTE — Consult Note (Addendum)
Cardiology Consultation:   Patient ID: Evan Rose. MRN: [id number]; DOB: 06-30-1956  Admit date: Sep 12, 2021 Date of Consult: 09/12/21  PCP:  Gorden Harms, PA-C   [facility] Providers Cardiologist:  None        Patient Profile:   Evan Rose. is a 66 y.o. male with a hx of PAD who is being seen 09/12/2021 for the evaluation of cardiogenic shock at the request of Dr. Elesa Massed.  History of Present Illness:   Mr. Kyler was seen by his healthcare power of attorney at 3 AM and was doing well.  She checked on him again at 315 and found him unresponsive.  EMS was called.  He was defibrillated.  He had CPR on 2 separate occasions for about 20 minutes each.  He had both ventricular fibrillation and PEA at times.  He received a total of 4 defibrillations.  Upon arrival to the emergency room, ECG confirmed the findings that were seen in the field.  He had severe anterolateral ST elevation once ROSC was achieved.  He was hypotensive and Levophed was started.  I spoke to his healthcare power of attorney at the bedside.   Past Medical History:  Diagnosis Date   Diabetes mellitus without complication (HCC)    Hypertension    Peripheral vascular disease (HCC)     Past Surgical History:  Procedure Laterality Date   COLONOSCOPY     ESOPHAGOGASTRODUODENOSCOPY (EGD) WITH PROPOFOL N/A 06/01/2016   Procedure: ESOPHAGOGASTRODUODENOSCOPY (EGD) WITH PROPOFOL;  Surgeon: Scot Jun, MD;  Location: [facility] ENDOSCOPY;  Service: Endoscopy;  Laterality: N/A;   LOWER EXTREMITY ANGIOGRAPHY Right 06/07/2019   Procedure: LOWER EXTREMITY ANGIOGRAPHY;  Surgeon: Renford Dills, MD;  Location: [facility] INVASIVE CV LAB;  Service: Cardiovascular;  Laterality: Right;       Inpatient Medications: Scheduled Meds:  fentaNYL       heparin       [MAR Hold] heparin injection (subcutaneous)  4,000 Units Subcutaneous Once   insulin aspart       Continuous Infusions:  amiodarone 60 mg/hr (09/12/21  0456)   amiodarone     fentaNYL     fentaNYL infusion INTRAVENOUS 50 mcg/hr (September 12, 2021 0513)   norepinephrine (LEVOPHED) Adult infusion 15 mcg/min (09-12-21 0642)   propofol     PRN Meds: Heparin (Porcine) in NaCl, heparin sodium (porcine), insulin aspart, iohexol, lidocaine (PF), midazolam  Allergies:   No Known Allergies  Social History:   Social History   Socioeconomic History   Marital status: Single    Spouse name: Not on file   Number of children: 0   Years of education: Not on file   Highest education level: Not on file  Occupational History   Not on file  Tobacco Use   Smoking status: Every Day    Packs/day: 0.10    Years: 50.00    Pack years: 5.00    Types: Cigarettes   Smokeless tobacco: Never   Tobacco comments:    1 cigarette /day  Substance and Sexual Activity   Alcohol use: Yes    Comment: last drink liquor on thursday   Drug use: No   Sexual activity: Not on file  Other Topics Concern   Not on file  Social History Narrative   Not on file   Social Determinants of Health   Financial Resource Strain: Not on file  Food Insecurity: Not on file  Transportation Needs: Not on file  Physical Activity: Not on file  Stress: Not on file  Social Connections: Not on file  Intimate Partner Violence: Not on file    Family History:    Family History  Problem Relation Age of Onset   Diabetes Sister      ROS:  Please see the history of present illness.  Unable to obtain as patient is intubated All other ROS reviewed and negative.     Physical Exam/Data:   Vitals:   12-Sep-2021 0448 09/12/2021 0449 Sep 12, 2021 0515  BP: (!) 155/91  111/74  Pulse: 87  65  Resp: 20  18  SpO2: 100%  99%  Weight:  69.8 kg   Height: 5\' 5"  (1.651 m) 5\' 5"  (1.651 m)     Intake/Output Summary (Last 24 hours) at 2021-09-12 0702 Last data filed at 2021-09-12 09/01/2021 Gross per 24 hour  Intake 14.8 ml  Output --  Net 14.8 ml   Last 3 Weights 09/12/2021 05/16/2020 04/04/2020  Weight  (lbs) 153 lb 14.1 oz 143 lb 143 lb  Weight (kg) 69.8 kg 64.864 kg 64.864 kg     Body mass index is 25.61 kg/m.  General: Thin, intubated, sedated HEENT: normal Neck: no JVD Vascular: No carotid bruits; Distal pulses 2+ bilaterally Cardiac:  RRR;  Lungs:  on vent Abd: soft, nontender, no hepatomegaly  Ext: no edema Musculoskeletal: Thin Skin: warm and dry  Neuro: Intubated, sedated, he did wake up somewhat and bite the tube, open his eyes Psych: Unable to assess  EKG:  The EKG was personally reviewed and demonstrates: Normal sinus rhythm, anterolateral ST elevation Telemetry:  Telemetry was personally reviewed and demonstrates: Normal sinus rhythm, PVCs  Relevant CV Studies:   Laboratory Data:  High Sensitivity Troponin:   Recent Labs  Lab 09-12-21 0444  TROPONINIHS 589*     Chemistry Recent Labs  Lab 09/12/2021 0444  NA 138  K 3.5  CL 98  CO2 26  GLUCOSE 259*  BUN 14  CREATININE 1.43*  CALCIUM 9.1  MG 2.3  GFRNONAA 54*  ANIONGAP 14    Recent Labs  Lab 09-12-21 0444  PROT 6.2*  ALBUMIN 3.2*  AST 88*  ALT 55*  ALKPHOS 159*  BILITOT 1.5*   Lipids No results for input(s): CHOL, TRIG, HDL, LABVLDL, LDLCALC, CHOLHDL in the last 168 hours.  Hematology Recent Labs  Lab 09-12-2021 0444  WBC 24.0*  RBC 4.16*  HGB 13.1  HCT 41.5  MCV 99.8  MCH 31.5  MCHC 31.6  RDW 13.9  PLT 169   Thyroid No results for input(s): TSH, FREET4 in the last 168 hours.  BNP Recent Labs  Lab 09/12/2021 0444  BNP 329.4*    DDimer No results for input(s): DDIMER in the last 168 hours.   Radiology/Studies:  DG Chest Portable 1 View  Result Date: 09/12/21 CLINICAL DATA:  Intubation EXAM: PORTABLE CHEST 1 VIEW COMPARISON:  Chest CT 05/06/2020 FINDINGS: Endotracheal tube with tip just below the clavicular heads. The enteric tube tip and side-port reaches the stomach. Bilateral airspace disease, confluent in the left and mainly at the upper lobe on the right. Normal heart  size. Large lung volumes. No visible effusion or air leak. Gas distended stomach IMPRESSION: 1. Unremarkable hardware. 2. Bilateral airspace disease with asymmetry suggesting pneumonia, worse on the left. 3. Gas distended stomach. Electronically Signed   By: 09/01/2021 M.D.   On: September 12, 2021 05:17     Assessment and Plan:   VF arrest, cardiogenic shock, acute anterolateral ST elevation MI: I spoke with the healthcare power of attorney.  She states that the patient is active.  She is the closest person to the patient in terms of family.  She states that he would want everything done.  She noted that he cannot read and she helps him with that.  That is partly why he requires the assistance from his power of attorney.  Cardiac cath performed.  Severe three-vessel coronary artery disease.  TIMI-3 flow restored to the LAD.  Chronically occluded circumflex.  Moderate RCA disease.  Collaterals noted to the circumflex.  Impella placed for cardiogenic shock.  He will be transferred to [facility].  He also had a right heart cath.  PA sat was 72%.  Cardiac output 4.97 L/min, cardiac index 2.8.  Plan is to transfer the patient to [facility] for further management.  I suspect his plan will depend on his neurologic recovery.  If he does make a good neurologic recovery, would need cardiac surgery consultation for possible bypass.  IV heparin given prior to transport to keep ACT around 300.  5U insulin given due to elevated glucose.   CRITICAL CARE Performed by: Lance Muss   Total critical care time: 35 minutes  Critical care time was exclusive of separately billable procedures and treating other patients.  Critical care was necessary to treat or prevent imminent or life-threatening deterioration.  Critical care was time spent personally by me on the following activities: development of treatment plan with patient and/or surrogate as well as nursing, discussions with consultants, evaluation of  patient's response to treatment, examination of patient, obtaining history from patient or surrogate, ordering and performing treatments and interventions, ordering and review of laboratory studies, ordering and review of radiographic studies, pulse oximetry and re-evaluation of patient's condition.  Family contact: HCPOANilda Riggs [phone]  Servando Snare [phone]   Risk Assessment/Risk Scores:     TIMI Risk Score for ST  Elevation MI:   The patient's TIMI risk score is  , which indicates a  % risk of all cause mortality at 30 days.   New York Heart Association (NYHA) Functional Class NYHA Class IV        For questions or updates, please contact [facility] Please consult [email or web address] for contact info under    Signed, Lance Muss, MD  09/22/2021 7:02 AM

## 2021-09-03 NOTE — ED Notes (Signed)
Pts key on brown key chain & black lighter given to POA. Clothing disposed of due to being soiled - POA verbalized understanding.

## 2021-09-03 NOTE — Progress Notes (Signed)
Patient arrived approximately 1000 from [facility] cath lab, critically ill and unstable with Impella. 2 pushes epi given, patient taken to CT. Epi and Levo requirements increased to 30 epi and 90 levo. Family and HPOA at bedside.Decision was made to place patient on comfort care. Patient died at 55 after terminal extubation. 71ml fentanyl wasted with Benay Pillow RN.

## 2021-09-03 NOTE — ED Triage Notes (Addendum)
Pt presents via EMS following a Cardiac arrest at home. Pt was found at home unresponsive at 0315 by family who was last seen well around 33. FD arrived on scene and initiated CPR around 0317; CPR lasted about 1-1.5 hours prior to arrival - ROSC was achieved 3 times with EMS and his last Rhythm showed ST elevation. The pt has two episodes of Vfib and received two shocks. Per EMS, the patient received "several" rounds of epi - unsure how many specifically. CPR in progress upon arrival. MD Ward, Danae Chen, RT, Primary RN Liane Comber), Charge RN (Megan), Stanton Kidney, RN, EDT (Melody & Eustaquio Maize), and this writer present at the bedside for assistance.

## 2021-09-03 NOTE — ED Notes (Signed)
Cardiologist is at the bedside 

## 2021-09-03 NOTE — CV Procedure (Signed)
° ° °  TRANSESOPHAGEAL ECHOCARDIOGRAM   NAME:  Viviana Simpler.   MRN: [id number] DOB:  December 10, 1955   ADMIT DATE: September 16, 2021  INDICATIONS:  Cardiac tamponade  PROCEDURE:   Emergent consent. Patient unresponsive on vent.  The transesophageal probe was inserted in the esophagus and stomach without difficulty and multiple views were obtained.    COMPLICATIONS:    There were no immediate complications.  FINDINGS:  LEFT VENTRICLE: EF = 20%. Severe global HK. Impella in appropriate position  RIGHT VENTRICLE: Severe HK. Markedly compressed by large anterior mediastinal hematoma.   LEFT ATRIUM: Compressed by external hematoma  LEFT ATRIAL APPENDAGE: Not seen  RIGHT ATRIUM: Compressed by external hematoma  AORTIC VALVE:  Trileaflet. Not opening with Impella in place. Mild AI  MITRAL VALVE:    Normal. Trivial MR  TRICUSPID VALVE: Normal.   PULMONIC VALVE: Grossly normal.  INTERATRIAL SEPTUM: No PFO or ASD.  PERICARDIUM: No effusion Very large anterior mediastinal hematoma  ASCENDING/DESCENDING AORTA: No dissection. Severe plaque   Truman Hayward 12:32 PM

## 2021-09-03 NOTE — H&P (Signed)
Advanced Heart Failure Team History and Physical Note   PCP:  Crissie Figures, PA-C  PCP-Cardiology: None     Reason for Admission: Acute MI/cardiogenic shock   HPI:    Evan Rose. is a 66 y.o. male with a hx of PAD, DM2, HTN, extensive tobacco use, cognitive delay who is being admitted from [facility] for further management of 3v CAD and cardiogenic shock post acute MI/cardiac arrest.   [name] was seen by his healthcare power of attorney at 3 AM and was doing well.  She checked on him again at 315 and found him unresponsive.  EMS was called.  He was defibrillated.  He had CPR on 2 separate occasions for about 20 minutes each.  He had both ventricular fibrillation and PEA at times.  He received a total of 4 defibrillations.  Upon arrival to the emergency room, ECG confirmed the findings that were seen in the field.  He had severe anterolateral ST elevation once ROSC was achieved.  He was hypotensive and Levophed was started. He received additional 5 mins CPR in ER  Takes to cath lab found to have high grade 3v CAD with high grade distal LM/ostial LAD disease, CTO ostial LCX and distal RCA disease. EF 20%. LAD open at time of cath (felt to be transiently occluded prior) Impella placed for support and transferred here.   Swan numbers at time of cath (after Impella) PA 42/13 (26) PCWP 13 Fick 5.0/2.8 PA sat 73%  Now on vent with Impella in place however waveforms flattened.     Review of Systems: Unavailable. Patient intubated/sedated   Home Medications Prior to Admission medications   Medication Sig Start Date End Date Taking? Authorizing Provider  albuterol (VENTOLIN HFA) 108 (90 Base) MCG/ACT inhaler Inhale into the lungs every 6 (six) hours as needed for wheezing or shortness of breath.    [provider]  aspirin EC 81 MG tablet Take 81 mg by mouth daily.    [provider]  atorvastatin (LIPITOR) 40 MG tablet Take 40 mg by mouth daily.    [provider]  clopidogrel (PLAVIX) 75 MG tablet Take 1 tablet (75 mg total) by mouth daily. 06/08/19   Schnier, Dolores Lory, MD  gabapentin (NEURONTIN) 300 MG capsule TAKE 1 CAPSULE BY MOUTH 3 TIMES A DAY. 05/08/20   Edrick Kins, DPM  lisinopril (PRINIVIL,ZESTRIL) 10 MG tablet Take 1 tablet (10 mg total) by mouth daily. 06/02/16   Loletha Grayer, MD  metformin (FORTAMET) 1000 MG (OSM) 24 hr tablet Take 1,000 mg by mouth daily with breakfast.    [provider]  nicotine (NICODERM CQ - DOSED IN MG/24 HOURS) 21 mg/24hr patch Place 1 patch (21 mg total) onto the skin daily. 06/02/16   Loletha Grayer, MD  pantoprazole (PROTONIX) 40 MG tablet Take 1 tablet (40 mg total) by mouth 2 (two) times daily. 06/02/16   Loletha Grayer, MD  sitaGLIPtin-metformin (JANUMET) 50-1000 MG tablet Take 1 tablet by mouth 2 (two) times daily with a meal.    [provider]    Past Medical History: Past Medical History:  Diagnosis Date   Diabetes mellitus without complication ([place])    Hypertension    Peripheral vascular disease ([place])     Past Surgical History: Past Surgical History:  Procedure Laterality Date   COLONOSCOPY     ESOPHAGOGASTRODUODENOSCOPY (EGD) WITH PROPOFOL N/A 06/01/2016   Procedure: ESOPHAGOGASTRODUODENOSCOPY (EGD) WITH PROPOFOL;  Surgeon: Manya Silvas, MD;  Location: [facility]  ENDOSCOPY;  Service: Endoscopy;  Laterality: N/A;   LOWER EXTREMITY ANGIOGRAPHY Right 06/07/2019   Procedure: LOWER EXTREMITY ANGIOGRAPHY;  Surgeon: Katha Cabal, MD;  Location: [place] CV LAB;  Service: Cardiovascular;  Laterality: Right;    Family History:  Family History  Problem Relation Age of Onset   Diabetes Sister     Social History: Social History   Socioeconomic History   Marital status: Single    Spouse name: Not on file   Number of children: 0   Years of education: Not on file   Highest education level: Not on file  Occupational History   Not on file  Tobacco  Use   Smoking status: Every Day    Packs/day: 0.10    Years: 50.00    Pack years: 5.00    Types: Cigarettes   Smokeless tobacco: Never   Tobacco comments:    1 cigarette /day  Substance and Sexual Activity   Alcohol use: Yes    Comment: last drink liquor on thursday   Drug use: No   Sexual activity: Not on file  Other Topics Concern   Not on file  Social History Narrative   Not on file   Social Determinants of Health   Financial Resource Strain: Not on file  Food Insecurity: Not on file  Transportation Needs: Not on file  Physical Activity: Not on file  Stress: Not on file  Social Connections: Not on file    Allergies:  No Known Allergies  Objective:    Vital Signs:   Pulse Rate:  [65-87] 69 (02/25 0743) Resp:  [17-29] 25 (02/25 0743) BP: (78-155)/(68-121) 127/113 (02/25 0743) SpO2:  [93 %-100 %] 98 % (02/25 0743) FiO2 (%):  [100 %] 100 % (02/25 0448) Weight:  [69.8 kg] 69.8 kg (02/25 0449)   There were no vitals filed for this visit.   Physical Exam     General:  Intubated/sedated HEENT: Normal + ETT Neck: Supple. no JVD. Carotids 2+ bilat; no bruits. No lymphadenopathy or thyromegaly appreciated. Cor: PMI nondisplaced. Regular rate & rhythm. No rubs, gallops or murmurs. Lungs: Coarse  Abdomen: Soft, nontender, nondistended. No hepatosplenomegaly. No bruits or masses. Good bowel sounds. Extremities: No cyanosis, clubbing, rash, edema Neuro: Alert & oriented x 3, cranial nerves grossly intact. moves all 4 extremities w/o difficulty. Affect pleasant.   Telemetry   Sinus 80-90s frequent PVCs Personally reviewed   EKG   Sinus 76 extensive anterolateral ST elevation  Labs     Basic Metabolic Panel: Recent Labs  Lab 27-Sep-2021 0444  NA 138  K 3.5  CL 98  CO2 26  GLUCOSE 259*  BUN 14  CREATININE 1.43*  CALCIUM 9.1  MG 2.3    Liver Function Tests: Recent Labs  Lab 09-27-21 0444  AST 88*  ALT 55*  ALKPHOS 159*  BILITOT 1.5*  PROT 6.2*   ALBUMIN 3.2*   No results for input(s): LIPASE, AMYLASE in the last 168 hours. No results for input(s): AMMONIA in the last 168 hours.  CBC: Recent Labs  Lab 09/27/2021 0444  WBC 24.0*  NEUTROABS 16.0*  HGB 13.1  HCT 41.5  MCV 99.8  PLT 169    Cardiac Enzymes: No results for input(s): CKTOTAL, CKMB, CKMBINDEX, TROPONINI in the last 168 hours.  BNP: BNP (last 3 results) Recent Labs    09/27/2021 0444  BNP 329.4*    ProBNP (last 3 results) No results for input(s): PROBNP in the last 8760 hours.   CBG: Recent  Labs  Lab 09-04-21 0441  GLUCAP 210*    Coagulation Studies: No results for input(s): LABPROT, INR in the last 72 hours.  Imaging: CARDIAC CATHETERIZATION  Result Date: 2021-09-04   Mid LM to Dist LM lesion is 80% stenosed.   Ost Cx to Prox Cx lesion is 100% stenosed.   Ramus lesion is 75% stenosed.   Dist LAD lesion is 70% stenosed.   Ost LM to Mid LM lesion is 50% stenosed.   Dist LM to Ost LAD lesion is 80% stenosed.   RPAV lesion is 75% stenosed.   Prox RCA lesion is 75% stenosed.   There is severe left ventricular systolic dysfunction.   LV end diastolic pressure is moderately elevated.   The left ventricular ejection fraction is less than 25% by visual estimate.   Hemodynamic findings consistent with mild pulmonary hypertension.   There is no aortic valve stenosis.   Aortic saturation 100%, PA saturation 73%, PA pressure 42/13, mean PA pressure 26 mmHg, mean pulmonary capillary wedge pressure 13 mmHg, cardiac output 4.97 L/min, cardiac index 2.8.  These values are obtained after Impella insertion. Severe three-vessel coronary artery disease detected in the setting of VF arrest and cardiogenic shock.  He now has TIMI-3 flow down his LAD which I suspect was transiently occluded.  His circumflex is chronically occluded with right to left collaterals and left to left collaterals.  Impella placed to provide hemodynamic support.  Weaning Levophed. Further plans will be  based on neurologic recovery.  Will need cardiac surgery consultation if he makes a significant neurologic recovery.   DG Chest Portable 1 View  Result Date: Sep 04, 2021 CLINICAL DATA:  Intubation EXAM: PORTABLE CHEST 1 VIEW COMPARISON:  Chest CT 05/06/2020 FINDINGS: Endotracheal tube with tip just below the clavicular heads. The enteric tube tip and side-port reaches the stomach. Bilateral airspace disease, confluent in the left and mainly at the upper lobe on the right. Normal heart size. Large lung volumes. No visible effusion or air leak. Gas distended stomach IMPRESSION: 1. Unremarkable hardware. 2. Bilateral airspace disease with asymmetry suggesting pneumonia, worse on the left. 3. Gas distended stomach. Electronically Signed   By: Jorje Guild M.D.   On: 2021/09/04 05:17    Assessment/Plan   1. Cardiac arrest in setting of acute anterolateral STEMI - Now intubated. Rhythm stable after reperfusion - continue hemodynamic support - normothermia - follow for neuro recovery  2. Acute systolic HF -> cardiogenic shock, post MI/arrest - EF 20% by v-gram - Impella placed 2/25 - adjust pressors/inotropes based on swan numbers - echo to further assess   3. CAD with acute anterolateral STEMI  - cath 2/25. 3v CAD with high grade distal LM/ostial LAD disease, CTO ostial LCX and distal RCA disease. EF 20%. LAD was open at time of cath (felt to be transiently occluded prior) Impella placed for support and transferred here for further management and possible CABG - ASA, heparin, statin  4. Acute hypoxic respiratory failure in setting of cardiac arrest - intubated - CCM consulted for help with vent management  5. CKD 3a - initial SCr 1.4 - follow closely   6. DM2 - SSI for now  7. PAD - watch LLE for ischemia in setting of Impella  8. COPD/ongoing tobacco use   CRITICAL CARE Performed by: Glori Bickers  Total critical care time: 60 minutes  Critical care time was exclusive  of separately billable procedures and treating other patients.  Critical care was necessary to treat or prevent  imminent or life-threatening deterioration.  Critical care was time spent personally by me (independent of midlevel providers or residents) on the following activities: development of treatment plan with patient and/or surrogate as well as nursing, discussions with consultants, evaluation of patient's response to treatment, examination of patient, obtaining history from patient or surrogate, ordering and performing treatments and interventions, ordering and review of laboratory studies, ordering and review of radiographic studies, pulse oximetry and re-evaluation of patient's condition.  Glori Bickers, MD  9:26 AM    Advanced Heart Failure Team Pager [phone] (M-F; 7a - 5p)  Please contact [facility] for night-coverage after hours (4p -7a ) and weekends on [email or web address]

## 2021-09-03 NOTE — Progress Notes (Signed)
°  Family has made decision to transition to comfort care  Versed/fentanyl bolused. Drips started.   I personally extubated and turned off all pressors and Impella.   Patient passed at 253p.   Additional 23mins CCT  Glori Bickers, MD  3:59 PM

## 2021-09-03 NOTE — Progress Notes (Signed)
°  Echocardiogram Echocardiogram Transesophageal has been performed.  Evan Rose Sep 29, 2021, 10:58 AM

## 2021-09-03 DEATH — deceased

## 2021-10-04 NOTE — Discharge Summary (Addendum)
°  Advanced Heart Failure Death Summary  Death Summary   Patient ID: Evan Rose. MRN: [id number], DOB/AGE: 1955-11-28 66 y.o. Admit date: Sep 19, 2021 Date Of Death: Sep 19, 2021      Primary Discharge Diagnoses:  1. Cardiac arrest in setting of acute anterolateral STEMI 2. Acute systolic HF -> cardiogenic shock, post MI/arrest 3. CAD with acute anterolateral STEMI  4. Acute hypoxic respiratory failure in setting of cardiac arrest 5. CKD 3a 6. DM2 7. PAD 8. COPD/ongoing tobacco use  Hospital Course:   [name]. was a 66 y.o. male with a hx of PAD, DM2, HTN, extensive tobacco use, cognitive delay admitted from [facility] for further management of 3v CAD and cardiogenic shock post acute MI/cardiac arrest.   Upon arrival to the emergency room, ECG confirmed the findings that were seen in the field.  He had severe anterolateral ST elevation once ROSC was achieved.  He was hypotensive and Levophed was started. He received additional 5 mins CPR in ER   Taken to cath lab found to have high grade 3v CAD with high grade distal LM/ostial LAD disease, CTO ostial LCX and distal RCA disease. EF 20%. LAD open at time of cath (felt to be transiently occluded prior) Impella placed for support and transferred here.    Transferred to [facility] for shock treatment with impella.  Poor waveforms on arrival. Echo obtained for impella placement . Echo concerning for large mediastinal hematoma. On high dose NE and Epi. Had urgent TEE at bedside that confirmed EF 20% with large anterior mediastinal hematoma compressing RV/RA/LA. No aortic dissection. CT  surgery consulted. He was not a candidate for  CABG due to severe underlying lung disease , cachexia, and MSOF.  Family made decision to transition to comfort care. Placed on comfort drips and terminally extubated with family at the bedside. [name] passed Sep 19, 2021 at 14:53.   Consultations  CT Surgery.    Duration of Discharge Encounter: Greater than 35 minutes    Signed, Darrick Grinder, NP 09/03/2021, 1:14 PM  Agree.   Glori Bickers, MD  10:07 PM

## 2021-11-03 MED FILL — Medication: Qty: 1 | Status: AC
